# Patient Record
Sex: Male | Born: 1935 | Race: White | Hispanic: No | Marital: Married | Smoking: Never smoker
Health system: Southern US, Community
[De-identification: ages and names within clinical notes are randomized; demographics above are authoritative.]

## PROBLEM LIST (undated history)

## (undated) DIAGNOSIS — E039 Hypothyroidism, unspecified: Secondary | ICD-10-CM

## (undated) DIAGNOSIS — I10 Essential (primary) hypertension: Secondary | ICD-10-CM

## (undated) HISTORY — PX: CERVICAL DISC SURGERY: SHX588

---

## 2013-01-28 ENCOUNTER — Ambulatory Visit: Payer: Self-pay | Admitting: Orthopedic Surgery

## 2013-01-28 ENCOUNTER — Inpatient Hospital Stay: Payer: Self-pay | Admitting: Student

## 2013-01-28 LAB — COMPREHENSIVE METABOLIC PANEL
ALBUMIN: 3.6 g/dL (ref 3.4–5.0)
ALK PHOS: 51 U/L
ALT: 30 U/L (ref 12–78)
AST: 37 U/L (ref 15–37)
Anion Gap: 5 — ABNORMAL LOW (ref 7–16)
BUN: 23 mg/dL — AB (ref 7–18)
Bilirubin,Total: 1.5 mg/dL — ABNORMAL HIGH (ref 0.2–1.0)
CALCIUM: 8.7 mg/dL (ref 8.5–10.1)
CO2: 26 mmol/L (ref 21–32)
Chloride: 104 mmol/L (ref 98–107)
Creatinine: 1.39 mg/dL — ABNORMAL HIGH (ref 0.60–1.30)
EGFR (African American): 54 — ABNORMAL LOW
GFR CALC NON AF AMER: 46 — AB
GLUCOSE: 114 mg/dL — AB (ref 65–99)
Osmolality: 275 (ref 275–301)
POTASSIUM: 4.1 mmol/L (ref 3.5–5.1)
Sodium: 135 mmol/L — ABNORMAL LOW (ref 136–145)
Total Protein: 7.1 g/dL (ref 6.4–8.2)

## 2013-01-28 LAB — CBC WITH DIFFERENTIAL/PLATELET
Basophil #: 0 10*3/uL (ref 0.0–0.1)
Basophil %: 0.3 %
EOS PCT: 0.6 %
Eosinophil #: 0 10*3/uL (ref 0.0–0.7)
HCT: 34 % — AB (ref 40.0–52.0)
HGB: 12.1 g/dL — AB (ref 13.0–18.0)
Lymphocyte #: 0.8 10*3/uL — ABNORMAL LOW (ref 1.0–3.6)
Lymphocyte %: 12.1 %
MCH: 37.5 pg — AB (ref 26.0–34.0)
MCHC: 35.5 g/dL (ref 32.0–36.0)
MCV: 106 fL — ABNORMAL HIGH (ref 80–100)
MONO ABS: 1 x10 3/mm (ref 0.2–1.0)
Monocyte %: 14.7 %
NEUTROS ABS: 4.9 10*3/uL (ref 1.4–6.5)
NEUTROS PCT: 72.3 %
Platelet: 111 10*3/uL — ABNORMAL LOW (ref 150–440)
RBC: 3.21 10*6/uL — AB (ref 4.40–5.90)
RDW: 12.8 % (ref 11.5–14.5)
WBC: 6.8 10*3/uL (ref 3.8–10.6)

## 2013-01-28 LAB — CBC
HCT: 34.7 % — AB (ref 40.0–52.0)
HGB: 12.2 g/dL — ABNORMAL LOW (ref 13.0–18.0)
MCH: 37 pg — ABNORMAL HIGH (ref 26.0–34.0)
MCHC: 35.2 g/dL (ref 32.0–36.0)
MCV: 105 fL — AB (ref 80–100)
PLATELETS: 122 10*3/uL — AB (ref 150–440)
RBC: 3.3 10*6/uL — AB (ref 4.40–5.90)
RDW: 12.8 % (ref 11.5–14.5)
WBC: 7 10*3/uL (ref 3.8–10.6)

## 2013-01-28 LAB — URINALYSIS, COMPLETE
Bacteria: NONE SEEN
Bilirubin,UR: NEGATIVE
Glucose,UR: NEGATIVE mg/dL (ref 0–75)
Leukocyte Esterase: NEGATIVE
Nitrite: NEGATIVE
Ph: 5 (ref 4.5–8.0)
Protein: 25
SPECIFIC GRAVITY: 1.02 (ref 1.003–1.030)
SQUAMOUS EPITHELIAL: NONE SEEN
WBC UR: 2 /HPF (ref 0–5)

## 2013-01-28 LAB — TROPONIN I: Troponin-I: <0.02 ng/mL

## 2013-01-30 LAB — BASIC METABOLIC PANEL
ANION GAP: 5 — AB (ref 7–16)
BUN: 19 mg/dL — ABNORMAL HIGH (ref 7–18)
CHLORIDE: 103 mmol/L (ref 98–107)
CO2: 26 mmol/L (ref 21–32)
Calcium, Total: 8.3 mg/dL — ABNORMAL LOW (ref 8.5–10.1)
Creatinine: 1.22 mg/dL (ref 0.60–1.30)
EGFR (African American): >60
GFR CALC NON AF AMER: 54 — AB
GLUCOSE: 121 mg/dL — AB (ref 65–99)
OSMOLALITY: 272 (ref 275–301)
Potassium: 3.6 mmol/L (ref 3.5–5.1)
Sodium: 134 mmol/L — ABNORMAL LOW (ref 136–145)

## 2013-01-30 LAB — SEDIMENTATION RATE: ERYTHROCYTE SED RATE: 69 mm/h — AB (ref 0–20)

## 2013-02-02 LAB — CULTURE, BLOOD (SINGLE)

## 2013-02-04 LAB — CULTURE, BLOOD (SINGLE)

## 2014-05-18 NOTE — Consult Note (Signed)
PATIENT NAME:  Jeffrey Nielsen, Jeffrey Nielsen MR#:  [id number] DATE OF BIRTH:  [birth year]  DATE OF CONSULTATION:  01/28/2013  CONSULTING PHYSICIAN:  Katha HammingSnehalatha Nakeia Calvi, MD  PRIMARY DOCTOR:  Dr. Leim FabryBarbara Aldridge  REQUESTING PHYSICIAN: Dr. Rodena MedinEckel, Dr. Claris Gladdenamasunder  REASON FOR CONSULT:  Preop clearance for right ankle fracture.   HISTORY OF PRESENT ILLNESS: This is an 81 year old male patient with dementia, hypertension, had a fall on Friday, and Saturday patient suffered a right ankle fracture. The patient's stepson not sure when the fracture happened, but patient was sitting at the edge of the bed and he misjudged where he was sitting, and then he fell on the floor and suffered a right ankle fracture. The patient's stepson said that he lives with a demented wife, and he cannot take care of both of them, and this guy needs lots of help at home with ambulation, so patient's son does not want to take him back. The orthopedic doctor, Dr. Rodena MedinEckel, spoke with Dr. Claris Gladdenamasunder, and patient will probably have surgery tomorrow. We are asked for medical clearance. The patient denies any chest pain or trouble breathing. Did not have any dizziness. According to the son, patient usually walks less than 10 feet overall for a long time, uses a walker. The patient also has a wheelchair, but does not use it. The patient's mobility at home is limited, anyway. The patient refused to come to ER on Saturday, but came today because right ankle was painful.    PAST MEDICAL HISTORY: Significant for hypertension, history of hypothyroidism, and dementia.   MEDICATIONS AT HOME: Aspirin 81 mg daily. Acetaminophen with codeine 300/30 mg 1 tablet p.o. every 6 hours as needed for pain. Cyanocobalamin 1000 mcg p.o. daily. Diazepam 5 mg p.o. daily. Donepezil 10 mg p.o. daily. Ferrous sulfate 325 mg p.o. b.i.d. Levothyroxine 112 mcg p.o. daily. Lisinopril 20 mg p.o. daily but, according to the stepson, he is not taking lisinopril because of his CKD, and he is  not taking lisinopril for about a month.   SOCIAL HISTORY: No smoking. Drinks about 1/2 cup of wine a day. No drugs.   SURGICAL HISTORY: The [facility], [facility].   FAMILY HISTORY: No hypertension or diabetes.   ALLERGIES: No known allergies.   REVIEW OF SYSTEMS:  CONSTITUTIONAL: The patient has no fever. No fatigue.  EYES: No blurred vision and no inflammation.  ENT: No tinnitus. No ear pain. No hearing loss. RESPIRATORY:  No cough. No wheezing. No hemoptysis.  CARDIOVASCULAR: No chest pain, no orthopnea, no PND.  GASTROINTESTINAL: No nausea. No vomiting. No abdominal pain.  GENITOURINARY: No dysuria or hematuria.  ENDOCRINE: No polyuria or nocturia.  INTEGUMENT: No skin rashes.  MUSCULOSKELETAL: Both ankles are swollen, and patient has pain over the ankle on medial and lateral side to palpation.  NEUROLOGICAL: The patient has dementia. At this time, he does not complain of any problems. PSYCHIATRIC:  Has history of dementia.   PHYSICAL EXAMINATION: VITAL SIGNS: Temperature 98.3, heart rate 84, blood pressure 130/66, sats 96% on room air.  GENERAL: The patient is alert, awake, oriented.  HEAD:  Normocephalic, atraumatic.  EYES: Pupils equally reacting to light. No conjunctival pallor. Extraocular movements intact.  NOSE: No nasal lesions.  EARS: No tympanic membrane congestion. No drainage.  MOUTH: No lesions. No exudates.  NECK: Supple. No JVD. No carotid bruit. Thyroid in the midline. RESPIRATORY: Good respiratory effort. Clear to auscultation. No wheeze. No rales.  CARDIOVASCULAR: S1, S2, regular. No murmurs. No gallops.  GASTROINTESTINAL: Abdomen is soft, nontender, nondistended. Bowel sounds present.  MUSCULOSKELETAL: The patient has ankle edema, bilateral pedal edema of the ankles. Right ankle is slightly tender to palpation on the medial side and lateral side.  SKIN:  Warm and dry, well-dehydrated.  NEUROLOGIC:  Cranial nerves II through XII intact. Power 5/5 upper and lower extremities. Sensation intact. DTRs  2+ bilaterally.   LAB DATA: Electrolytes: Sodium 135, potassium 4.1, chloride 104, bicarb 26, BUN 23, creatinine 1.39, glucose 114. WBC 7, hemoglobin 12.2, hematocrit 34.7, platelets 122. Troponin less than 0.02. Ankle x-ray shows right malleolar angle fracture with widening of medial tibial  articular space.The patient also has regional subcutaneous edema. This is right ankle x-ray.   ASSESSMENT AND PLAN: The patient is an 81 year old male with right ankle fracture after   a mechanical fall. The patient has hypertension, dementia, and hypothyroidism. At moderate risk because of advanced age and other medical problems, but surgery can be performed; (Continue IV fluids and use  p.o. beta blockers like metoprolol 2.5 IV q. 4 to 6 hours p.r.n. for blood pressure more than 160/90, and use incentive spirometry. Recommend deep vein thrombosis prophylaxis. Continue other medications for his dementia, hypothyroidism.   Time spent on this consult is about 60 minutes.    ____________________________ Katha Hamming, MD sk:mr D: 01/28/2013 16:53:09 ET T: 01/28/2013 17:45:22 ET JOB#: [id number]  cc: Katha Hamming, MD, <Dictator> Katha Hamming MD ELECTRONICALLY SIGNED 03/05/2013 13:39

## 2014-05-18 NOTE — Discharge Summary (Signed)
PATIENT NAME:  Jeffrey Nielsen, Jeffrey Nielsen MR#:  [id number] DATE OF BIRTH:  January 10, 1927  DATE OF ADMISSION:  01/28/2013 DATE OF DISCHARGE: 02/01/2013   PRIMARY CARE PHYSICIAN: Dr. Leim FabryBarbara Aldridge.   CHIEF COMPLAINT: Status post fall.   DISCHARGE DIAGNOSES:  1.  Right-sided trimalleolar ankle fracture.  2.  Enterococcus bacteremia, unknown source.  3.  Dementia.  4.  Hypertension.  5.  Hypothyroidism.   DISCHARGE MEDICATIONS: Aspirin 81 mg daily, cyanocobalamin 1000 mcg daily, diazepam 5 mg daily, donepezil 10 mg daily, ferrous sulfate 325 mg 2 times a day, levothyroxine 112 mcg daily, lisinopril 20 mg daily, diclofenac topical 1% gel 2 grams topically to affected area 2 times a day, amoxicillin 500 mg 2 caps every 8t hours until the evening of the 18th of January.   DIET: Low sodium.   ACTIVITY: As tolerated without any bearing on the right lower extremity.   FOLLOWUP: Please follow with PCP within 1 to 2 weeks. Please follow with orthopedics within 1 week.   DISPOSITION: Home.  For full and further limitations, please refer to orthopedic discharge instructions.  SIGNIFICANT LABS AND IMAGING: Initial BUN 23, creatinine 1.39, sodium 135. Initial blood cultures with 1 out of 2 bottles for Enterococcus faecalis. Repeat blood cultures on the 6th, no growth to date x 2. Urinalysis does not suggest an infection. Echocardiogram done on January 06 shows ejection fraction of 60% to 65%, impaired relaxation pattern and no mention of possible vegetation. An x-ray of the chest, one view, on the 4th showing no active disease. Right ankle complete x-ray showing trimalleolar fracture with widening of the medial tibiotalar articular space. Right ankle AP and lateral, showing no significant change of the trimalleolar fracture with a cast on January 04.   HISTORY OF PRESENT ILLNESS AND HOSPITAL COURSE: For full details of H and P, please see the dictation on day of admission by the admitting physician Dr. Rodena MedinEckel from  orthopedics. The patient is an 81 year old male with dementia, who had a few falls. He came in with a trimalleolar fracture and admitted to the orthopedic service and medicine was initially consulted for preop evaluation. Per the patient's step-son, he is not sure when the fracture happened as patient had a couple of falls on 2 different days. The bed is kind of high for the patient and he misjudged where he was sitting, fell down and suffered a right ankle fracture. Of note, he did not have any fevers, chills, or significant leukocytosis; however, some blood cultures were sent on the day of admission and 1 out of 2 bottles came back for gram-positive cocci. Surgery was therefore held and the patient was started on vancomycin. Ultimately, the blood cultures grew enterococcus. Repeat blood cultures have been sent on the 6th and there has been no growth to date. However, given the bacteremia, the surgery will be rescheduled at a later date. I ran the case with infectious disease doctor, who feels comfortable with about 10 to 14 days of total treatment for this enterococcus with negative followup blood cultures. We would go ahead and discharge him on a total of 14 days of treatment. Would discharge him on amoxicillin p.o. q.8 hours. In regards to his hypertension, it has remained stable. He will follow with orthopedics as an outpatient for the ankle fracture. At this point, he denies any pain.   TOTAL TIME SPENT: 35 minutes.   ____________________________ Krystal EatonShayiq Karess Harner, MD sa:aw D: 02/01/2013 13:30:53 ET T: 02/01/2013 13:43:28 ET JOB#: [id number]  cc: HYQMVHShayiq  Jacques Navy, MD, <Dictator> Katina Dung. Dayna Barker, MD Krystal Eaton MD ELECTRONICALLY SIGNED 02/06/2013 11:35

## 2014-05-18 NOTE — H&P (Signed)
   Subjective/Chief Complaint right ankle pain   History of Present Illness 81 y/o male who sustained a fall with immediate ankle pain and inability to bear weight   Past History See below   Past Med/Surgical Hx:  Anemia:   chronic kidney disease:   fecal incontinence:   Incontinence, Urinary:   GERD - Esophageal Reflux:   Anxiety:   Arthritis:   Hypothyroidism:   HTN:   Dementia:   ALLERGIES:  No Known Allergies:   Family and Social History:  Family History Non-Contributory   Social History negative tobacco, negative ETOH   Place of Living Home   Review of Systems:  Fever/Chills No   Cough No   SOB/DOE No   Chest Pain No   Physical Exam:  GEN thin   NECK supple   RESP normal resp effort   CARD regular rate   ABD denies tenderness   EXTR positive edema, RLE   NEURO motor/sensory function intact   PSYCH poor insight   Additional Comments Mildly demented    Assessment/Admission Diagnosis Right closed bimalleolar ankle fracture   Plan Patient will be admitted and elevated overnight to control swelling. Depending on his soft tissue tomorrow, we may proceed with ORIF or possibly nonoperative management of his fracture   Electronic Signatures: Danelle EarthlyEckel, Tobin T (MD)  (Signed 04-Jan-15 19:24)  Authored: CHIEF COMPLAINT and HISTORY, PAST MEDICAL/SURGIAL HISTORY, ALLERGIES, FAMILY AND SOCIAL HISTORY, REVIEW OF SYSTEMS, PHYSICAL EXAM, ASSESSMENT AND PLAN   Last Updated: 04-Jan-15 19:24 by Danelle EarthlyEckel, Tobin T (MD)

## 2014-05-18 NOTE — Consult Note (Signed)
PATIENT NAME:  Jeffrey Nielsen, Jeffrey Nielsen MR#:  [id number] DATE OF BIRTH:  [birth year]  DATE OF CONSULTATION:  01/28/2013  CONSULTING PHYSICIAN:  Katha HammingSnehalatha Delaynie Stetzer, MD  ADDENDUM FOR CONSULTATION:     Chest x-ray is clear and EKG looks normal sinus rhythm with no ST-T changes. So, the  patient is at moderate risk because of his advanced age.    ____________________________ Katha HammingSnehalatha Floride Hutmacher, MD sk:cc D: 01/28/2013 18:51:43 ET T: 01/28/2013 19:29:21 ET JOB#: [id number]  cc: Katha HammingSnehalatha Arissa Fagin, MD, <Dictator> Katha HammingSNEHALATHA Crucita Lacorte MD ELECTRONICALLY SIGNED 03/05/2013 13:37

## 2020-03-29 ENCOUNTER — Encounter: Payer: Self-pay | Admitting: Radiology

## 2020-03-29 ENCOUNTER — Inpatient Hospital Stay
Admission: EM | Admit: 2020-03-29 | Discharge: 2020-04-01 | DRG: 315 | Disposition: A | Discharge status: Skilled Nursing Facility | Payer: Medicare Other | Attending: Internal Medicine | Admitting: Internal Medicine

## 2020-03-29 ENCOUNTER — Other Ambulatory Visit: Payer: Self-pay

## 2020-03-29 ENCOUNTER — Emergency Department: Payer: Medicare Other

## 2020-03-29 DIAGNOSIS — N179 Acute kidney failure, unspecified: Secondary | ICD-10-CM | POA: Diagnosis present

## 2020-03-29 DIAGNOSIS — G301 Alzheimer's disease with late onset: Secondary | ICD-10-CM

## 2020-03-29 DIAGNOSIS — R55 Syncope and collapse: Secondary | ICD-10-CM

## 2020-03-29 DIAGNOSIS — I959 Hypotension, unspecified: Principal | ICD-10-CM | POA: Diagnosis present

## 2020-03-29 DIAGNOSIS — I248 Other forms of acute ischemic heart disease: Secondary | ICD-10-CM | POA: Diagnosis present

## 2020-03-29 DIAGNOSIS — I10 Essential (primary) hypertension: Secondary | ICD-10-CM | POA: Diagnosis present

## 2020-03-29 DIAGNOSIS — G309 Alzheimer's disease, unspecified: Secondary | ICD-10-CM | POA: Diagnosis present

## 2020-03-29 DIAGNOSIS — F0390 Unspecified dementia without behavioral disturbance: Secondary | ICD-10-CM

## 2020-03-29 DIAGNOSIS — E860 Dehydration: Secondary | ICD-10-CM | POA: Diagnosis present

## 2020-03-29 DIAGNOSIS — Z79899 Other long term (current) drug therapy: Secondary | ICD-10-CM

## 2020-03-29 DIAGNOSIS — E872 Acidosis: Secondary | ICD-10-CM | POA: Diagnosis present

## 2020-03-29 DIAGNOSIS — E039 Hypothyroidism, unspecified: Secondary | ICD-10-CM | POA: Diagnosis present

## 2020-03-29 DIAGNOSIS — Z66 Do not resuscitate: Secondary | ICD-10-CM | POA: Diagnosis present

## 2020-03-29 DIAGNOSIS — Z20822 Contact with and (suspected) exposure to covid-19: Secondary | ICD-10-CM | POA: Diagnosis present

## 2020-03-29 DIAGNOSIS — F0280 Dementia in other diseases classified elsewhere without behavioral disturbance: Secondary | ICD-10-CM | POA: Diagnosis present

## 2020-03-29 DIAGNOSIS — D649 Anemia, unspecified: Secondary | ICD-10-CM | POA: Diagnosis present

## 2020-03-29 DIAGNOSIS — R1310 Dysphagia, unspecified: Secondary | ICD-10-CM | POA: Diagnosis present

## 2020-03-29 DIAGNOSIS — D61818 Other pancytopenia: Secondary | ICD-10-CM

## 2020-03-29 DIAGNOSIS — Z7952 Long term (current) use of systemic steroids: Secondary | ICD-10-CM

## 2020-03-29 DIAGNOSIS — E875 Hyperkalemia: Secondary | ICD-10-CM | POA: Diagnosis present

## 2020-03-29 DIAGNOSIS — R778 Other specified abnormalities of plasma proteins: Secondary | ICD-10-CM

## 2020-03-29 DIAGNOSIS — Z79890 Hormone replacement therapy (postmenopausal): Secondary | ICD-10-CM

## 2020-03-29 DIAGNOSIS — I493 Ventricular premature depolarization: Secondary | ICD-10-CM | POA: Diagnosis present

## 2020-03-29 HISTORY — DX: Hypothyroidism, unspecified: E03.9

## 2020-03-29 HISTORY — DX: Essential (primary) hypertension: I10

## 2020-03-29 LAB — CBC WITH DIFFERENTIAL/PLATELET
Abs Immature Granulocytes: 0.01 10*3/uL (ref 0.00–0.07)
Basophils Absolute: 0 10*3/uL (ref 0.0–0.1)
Basophils Relative: 0 %
Eosinophils Absolute: 0.1 10*3/uL (ref 0.0–0.5)
Eosinophils Relative: 2 %
HCT: 29.8 % — ABNORMAL LOW (ref 39.0–52.0)
Hemoglobin: 10.3 g/dL — ABNORMAL LOW (ref 13.0–17.0)
Immature Granulocytes: 0 %
Lymphocytes Relative: 23 %
Lymphs Abs: 0.7 10*3/uL (ref 0.7–4.0)
MCH: 37.3 pg — ABNORMAL HIGH (ref 26.0–34.0)
MCHC: 34.6 g/dL (ref 30.0–36.0)
MCV: 108 fL — ABNORMAL HIGH (ref 80.0–100.0)
Monocytes Absolute: 0.3 10*3/uL (ref 0.1–1.0)
Monocytes Relative: 11 %
Neutro Abs: 1.9 10*3/uL (ref 1.7–7.7)
Neutrophils Relative %: 64 %
Platelets: 70 10*3/uL — ABNORMAL LOW (ref 150–400)
RBC: 2.76 MIL/uL — ABNORMAL LOW (ref 4.22–5.81)
RDW: 13.1 % (ref 11.5–15.5)
WBC: 3 10*3/uL — ABNORMAL LOW (ref 4.0–10.5)
nRBC: 0 % (ref 0.0–0.2)

## 2020-03-29 LAB — BASIC METABOLIC PANEL
Anion gap: 9 (ref 5–15)
BUN: 45 mg/dL — ABNORMAL HIGH (ref 8–23)
CO2: 18 mmol/L — ABNORMAL LOW (ref 22–32)
Calcium: 7.7 mg/dL — ABNORMAL LOW (ref 8.9–10.3)
Chloride: 112 mmol/L — ABNORMAL HIGH (ref 98–111)
Creatinine, Ser: 1.88 mg/dL — ABNORMAL HIGH (ref 0.61–1.24)
GFR, Estimated: 33 mL/min — ABNORMAL LOW (ref >60)
Glucose, Bld: 132 mg/dL — ABNORMAL HIGH (ref 70–99)
Potassium: 4.7 mmol/L (ref 3.5–5.1)
Sodium: 139 mmol/L (ref 135–145)

## 2020-03-29 LAB — RESP PANEL BY RT-PCR (FLU A&B, COVID) ARPGX2
Influenza A by PCR: NEGATIVE
Influenza B by PCR: NEGATIVE
SARS Coronavirus 2 by RT PCR: NEGATIVE

## 2020-03-29 LAB — TROPONIN I (HIGH SENSITIVITY): Troponin I (High Sensitivity): 38 ng/L — ABNORMAL HIGH (ref <18)

## 2020-03-29 MED ORDER — SODIUM CHLORIDE 0.9 % IV BOLUS
500.0000 mL | Freq: Once | INTRAVENOUS | Status: AC
  Administered 2020-03-29: 500 mL via INTRAVENOUS

## 2020-03-29 NOTE — ED Triage Notes (Signed)
BIB EMS from [facility]. Per facility pt had multiple syncopal episodes today.

## 2020-03-29 NOTE — ED Provider Notes (Signed)
[facility] Emergency Department Provider Note  ____________________________________________  Time seen: Approximately 11:44 PM  I have reviewed the triage vital signs and the nursing notes.   HISTORY  Chief Complaint Loss of Consciousness    Level 5 Caveat: Portions of the History and Physical including HPI and review of systems are unable to be completely obtained due to patient being a poor historian   HPI Jeffrey Nielsen is a 86 y.o. male with a past history of hypertension, dementia who was sent to the ED due to recurrent syncope today.  Patient denies any complaints, states that he feels fine.  EMS report that blood pressure was about 80/40 supine, dropped to 60/40 when he was sitting upright.      History reviewed. No pertinent past medical history.   There are no problems to display for this patient.       Prior to Admission medications   Medication Sig Start Date End Date Taking? Authorizing Provider  ALOE VESTA CLEAR ANTIFUNGAL 2 % OINT Apply topically. 02/08/20  Yes [provider]  divalproex (DEPAKOTE) 125 MG DR tablet Take 125 mg by mouth at bedtime. 03/18/20  Yes [provider]  donepezil (ARICEPT) 10 MG tablet Take 10 mg by mouth daily. 03/18/20  Yes [provider]  levothyroxine (SYNTHROID) 112 MCG tablet Take 112 mcg by mouth daily. 03/10/20  Yes [provider]  lisinopril (ZESTRIL) 20 MG tablet Take 20 mg by mouth daily. 02/29/20  Yes [provider]  loperamide (IMODIUM A-D) 2 MG tablet Take 2 mg by mouth 4 (four) times daily as needed for diarrhea or loose stools. Take after each loose stool, not to exceed 8 mg in 24 hours.   Yes [provider]     Allergies Patient has no known allergies.   No family history on file.  Social History    Review of Systems Level 5 Caveat: Portions of the History and Physical including HPI and review of systems are unable to be completely  obtained due to patient being a poor historian   Constitutional:   No known fever.  ENT:   No rhinorrhea. Cardiovascular:   No chest pain or syncope. Respiratory:   No dyspnea or cough. Gastrointestinal:   Negative for abdominal pain, vomiting and diarrhea.  Musculoskeletal:   Negative for focal pain or swelling ____________________________________________   PHYSICAL EXAM:  VITAL SIGNS: ED Triage Vitals  Enc Vitals Group     BP 03/29/20 2220 (!) 104/45     Pulse Rate 03/29/20 2220 78     Resp 03/29/20 2220 13     Temp 03/29/20 2220 98 F (36.7 C)     Temp Source 03/29/20 2220 Oral     SpO2 03/29/20 2220 95 %     Weight 03/29/20 2219 216 lb (98 kg)     Height 03/29/20 2219 6\' 4"  (1.93 m)     Head Circumference --      Peak Flow --      Pain Score 03/29/20 2218 0     Pain Loc --      Pain Edu? --      Excl. in GC? --     Vital signs reviewed, nursing assessments reviewed.   Constitutional: Awake and alert, oriented to self. Non-toxic appearance. Eyes:   Conjunctivae are normal. EOMI. PERRL. ENT      Head:   Normocephalic and atraumatic.      Nose:   No congestion/rhinnorhea.  Mouth/Throat:   Dry mucous membranes, no pharyngeal erythema. No peritonsillar mass.       Neck:   No meningismus. Full ROM. Hematological/Lymphatic/Immunilogical:   No cervical lymphadenopathy. Cardiovascular:   RRR. Symmetric bilateral radial and DP pulses.  No murmurs. Cap refill less than 2 seconds. Respiratory:   Normal respiratory effort without tachypnea/retractions. Breath sounds are clear and equal bilaterally. No wheezes/rales/rhonchi. Gastrointestinal:   Soft and nontender. Non distended. There is no CVA tenderness.  No rebound, rigidity, or guarding.  Musculoskeletal:   Normal range of motion in all extremities. No joint effusions.  No lower extremity tenderness.  No edema. Neurologic:   Normal speech and language.  Motor grossly intact. No acute focal neurologic deficits are  appreciated.  Skin:    Skin is warm, dry and intact. No rash noted.  No petechiae, purpura, or bullae.  ____________________________________________    LABS (pertinent positives/negatives) (all labs ordered are listed, but only abnormal results are displayed) Labs Reviewed  BASIC METABOLIC PANEL - Abnormal; Notable for the following components:      Result Value   Chloride 112 (*)    CO2 18 (*)    Glucose, Bld 132 (*)    BUN 45 (*)    Creatinine, Ser 1.88 (*)    Calcium 7.7 (*)    GFR, Estimated 33 (*)    All other components within normal limits  CBC WITH DIFFERENTIAL/PLATELET - Abnormal; Notable for the following components:   WBC 3.0 (*)    RBC 2.76 (*)    Hemoglobin 10.3 (*)    HCT 29.8 (*)    MCV 108.0 (*)    MCH 37.3 (*)    Platelets 70 (*)    All other components within normal limits  TROPONIN I (HIGH SENSITIVITY) - Abnormal; Notable for the following components:   Troponin I (High Sensitivity) 38 (*)    All other components within normal limits  RESP PANEL BY RT-PCR (FLU A&B, COVID) ARPGX2  URINE CULTURE  URINALYSIS, COMPLETE (UACMP) WITH MICROSCOPIC   ____________________________________________   EKG  Interpreted by me Normal sinus rhythm rate of 78, normal axis and intervals.  Normal QRS ST segments and T waves.  1 pair of PVCs on the strip.  ____________________________________________    RADIOLOGY  DG Chest Portable 1 View  Result Date: 03/29/2020 CLINICAL DATA:  Weakness. EXAM: PORTABLE CHEST 1 VIEW COMPARISON:  Chest x-ray 01/28/2013 FINDINGS: The heart size and mediastinal contours are unchanged. Aortic arch calcifications. No focal consolidation. No pulmonary edema. No pleural effusion. No pneumothorax. No acute osseous abnormality. IMPRESSION: No active disease. Electronically Signed   By: Tish Frederickson M.D.   On: 03/29/2020 22:58    ____________________________________________   PROCEDURES .Critical Care Performed by: Sharman Cheek,  MD Authorized by: Sharman Cheek, MD   Critical care provider statement:    Critical care time (minutes):  35   Critical care time was exclusive of:  Separately billable procedures and treating other patients   Critical care was necessary to treat or prevent imminent or life-threatening deterioration of the following conditions:  Dehydration and circulatory failure   Critical care was time spent personally by me on the following activities:  Development of treatment plan with patient or surrogate, discussions with consultants, evaluation of patient's response to treatment, examination of patient, obtaining history from patient or surrogate, ordering and performing treatments and interventions, ordering and review of laboratory studies, ordering and review of radiographic studies, pulse oximetry, re-evaluation of patient's condition and review of  old charts    ____________________________________________  DIFFERENTIAL DIAGNOSIS   Dehydration, electrolyte abnormality, pneumonia, UTI, viral illness, non-STEMI  CLINICAL IMPRESSION / ASSESSMENT AND PLAN / ED COURSE  Medications ordered in the ED: Medications  sodium chloride 0.9 % bolus 500 mL (0 mLs Intravenous Stopped 03/29/20 2300)    Pertinent labs & imaging results that were available during my care of the patient were reviewed by me and considered in my medical decision making (see chart for details).   Jeffrey Nielsen was evaluated in Emergency Department on 03/29/2020 for the symptoms described in the history of present illness. He was evaluated in the context of the global COVID-19 pandemic, which necessitated consideration that the patient might be at risk for infection with the SARS-CoV-2 virus that causes COVID-19. Institutional protocols and algorithms that pertain to the evaluation of patients at risk for COVID-19 are in a state of rapid change based on information released by regulatory bodies including the CDC and federal and state  organizations. These policies and algorithms were followed during the patient's care in the ED.   Patient sent to the ED due to syncope at his nursing facility.  Found to be hypotensive, worsened with orthostatic changes.  Patient denies symptoms but is unreliable due to his dementia.  Exam is benign.  Chest x-ray viewed and interpreted by me, no pneumonia.  Radiology report also agrees is unremarkable.  Initial serum labs show AKI.  Awaiting Covid and urinalysis results.  With syncope, hypotension, AKI and patient's age, will plan to admit for hydration and monitoring.  Patient not septic with otherwise normal vital signs      ____________________________________________   FINAL CLINICAL IMPRESSION(S) / ED DIAGNOSES    Final diagnoses:  Hypotension, unspecified hypotension type  Dementia without behavioral disturbance, unspecified dementia type [facility])     ED Discharge Orders    None      Portions of this note were generated with dragon dictation software. Dictation errors may occur despite best attempts at proofreading.   Sharman Cheek, MD 03/29/20 2348

## 2020-03-30 ENCOUNTER — Encounter: Payer: Self-pay | Admitting: Internal Medicine

## 2020-03-30 DIAGNOSIS — Z20822 Contact with and (suspected) exposure to covid-19: Secondary | ICD-10-CM | POA: Diagnosis present

## 2020-03-30 DIAGNOSIS — R55 Syncope and collapse: Secondary | ICD-10-CM | POA: Diagnosis present

## 2020-03-30 DIAGNOSIS — I248 Other forms of acute ischemic heart disease: Secondary | ICD-10-CM | POA: Diagnosis present

## 2020-03-30 DIAGNOSIS — R1310 Dysphagia, unspecified: Secondary | ICD-10-CM | POA: Diagnosis present

## 2020-03-30 DIAGNOSIS — E039 Hypothyroidism, unspecified: Secondary | ICD-10-CM | POA: Diagnosis present

## 2020-03-30 DIAGNOSIS — E875 Hyperkalemia: Secondary | ICD-10-CM | POA: Diagnosis present

## 2020-03-30 DIAGNOSIS — E860 Dehydration: Secondary | ICD-10-CM | POA: Diagnosis present

## 2020-03-30 DIAGNOSIS — D649 Anemia, unspecified: Secondary | ICD-10-CM | POA: Diagnosis present

## 2020-03-30 DIAGNOSIS — E872 Acidosis: Secondary | ICD-10-CM | POA: Diagnosis present

## 2020-03-30 DIAGNOSIS — G301 Alzheimer's disease with late onset: Secondary | ICD-10-CM | POA: Diagnosis not present

## 2020-03-30 DIAGNOSIS — I959 Hypotension, unspecified: Secondary | ICD-10-CM | POA: Diagnosis present

## 2020-03-30 DIAGNOSIS — I10 Essential (primary) hypertension: Secondary | ICD-10-CM | POA: Diagnosis present

## 2020-03-30 DIAGNOSIS — D61818 Other pancytopenia: Secondary | ICD-10-CM | POA: Diagnosis present

## 2020-03-30 DIAGNOSIS — G309 Alzheimer's disease, unspecified: Secondary | ICD-10-CM | POA: Diagnosis present

## 2020-03-30 DIAGNOSIS — Z79890 Hormone replacement therapy (postmenopausal): Secondary | ICD-10-CM | POA: Diagnosis not present

## 2020-03-30 DIAGNOSIS — F0280 Dementia in other diseases classified elsewhere without behavioral disturbance: Secondary | ICD-10-CM | POA: Diagnosis present

## 2020-03-30 DIAGNOSIS — Z66 Do not resuscitate: Secondary | ICD-10-CM | POA: Diagnosis present

## 2020-03-30 DIAGNOSIS — I493 Ventricular premature depolarization: Secondary | ICD-10-CM | POA: Diagnosis present

## 2020-03-30 DIAGNOSIS — N179 Acute kidney failure, unspecified: Secondary | ICD-10-CM | POA: Diagnosis present

## 2020-03-30 DIAGNOSIS — Z79899 Other long term (current) drug therapy: Secondary | ICD-10-CM | POA: Diagnosis not present

## 2020-03-30 DIAGNOSIS — R778 Other specified abnormalities of plasma proteins: Secondary | ICD-10-CM

## 2020-03-30 DIAGNOSIS — Z7952 Long term (current) use of systemic steroids: Secondary | ICD-10-CM | POA: Diagnosis not present

## 2020-03-30 LAB — IRON AND TIBC
Iron: 58 ug/dL (ref 45–182)
Saturation Ratios: 28 % (ref 17.9–39.5)
TIBC: 210 ug/dL — ABNORMAL LOW (ref 250–450)
UIBC: 152 ug/dL

## 2020-03-30 LAB — RETICULOCYTES
Immature Retic Fract: 12.3 % (ref 2.3–15.9)
RBC.: 2.86 MIL/uL — ABNORMAL LOW (ref 4.22–5.81)
Retic Count, Absolute: 38.9 10*3/uL (ref 19.0–186.0)
Retic Ct Pct: 1.4 % (ref 0.4–3.1)

## 2020-03-30 LAB — CBC
HCT: 28.5 % — ABNORMAL LOW (ref 39.0–52.0)
Hemoglobin: 10.1 g/dL — ABNORMAL LOW (ref 13.0–17.0)
MCH: 37.8 pg — ABNORMAL HIGH (ref 26.0–34.0)
MCHC: 35.4 g/dL (ref 30.0–36.0)
MCV: 106.7 fL — ABNORMAL HIGH (ref 80.0–100.0)
Platelets: 68 10*3/uL — ABNORMAL LOW (ref 150–400)
RBC: 2.67 MIL/uL — ABNORMAL LOW (ref 4.22–5.81)
RDW: 13 % (ref 11.5–15.5)
WBC: 2.6 10*3/uL — ABNORMAL LOW (ref 4.0–10.5)
nRBC: 0 % (ref 0.0–0.2)

## 2020-03-30 LAB — URINALYSIS, COMPLETE (UACMP) WITH MICROSCOPIC
Bilirubin Urine: NEGATIVE
Glucose, UA: NEGATIVE mg/dL
Hgb urine dipstick: NEGATIVE
Ketones, ur: 5 mg/dL — AB
Leukocytes,Ua: NEGATIVE
Nitrite: NEGATIVE
Protein, ur: NEGATIVE mg/dL
Specific Gravity, Urine: 1.018 (ref 1.005–1.030)
Squamous Epithelial / HPF: NONE SEEN (ref 0–5)
pH: 5 (ref 5.0–8.0)

## 2020-03-30 LAB — BASIC METABOLIC PANEL
Anion gap: 4 — ABNORMAL LOW (ref 5–15)
BUN: 44 mg/dL — ABNORMAL HIGH (ref 8–23)
CO2: 20 mmol/L — ABNORMAL LOW (ref 22–32)
Calcium: 8 mg/dL — ABNORMAL LOW (ref 8.9–10.3)
Chloride: 113 mmol/L — ABNORMAL HIGH (ref 98–111)
Creatinine, Ser: 1.76 mg/dL — ABNORMAL HIGH (ref 0.61–1.24)
GFR, Estimated: 36 mL/min — ABNORMAL LOW (ref >60)
Glucose, Bld: 135 mg/dL — ABNORMAL HIGH (ref 70–99)
Potassium: 5.3 mmol/L — ABNORMAL HIGH (ref 3.5–5.1)
Sodium: 137 mmol/L (ref 135–145)

## 2020-03-30 LAB — FERRITIN: Ferritin: 436 ng/mL — ABNORMAL HIGH (ref 24–336)

## 2020-03-30 LAB — TROPONIN I (HIGH SENSITIVITY): Troponin I (High Sensitivity): 42 ng/L — ABNORMAL HIGH (ref <18)

## 2020-03-30 LAB — TSH: TSH: 0.262 u[IU]/mL — ABNORMAL LOW (ref 0.350–4.500)

## 2020-03-30 LAB — VITAMIN B12: Vitamin B-12: 280 pg/mL (ref 180–914)

## 2020-03-30 LAB — FOLATE: Folate: 10.2 ng/mL (ref >5.9)

## 2020-03-30 MED ORDER — ONDANSETRON HCL 4 MG/2ML IJ SOLN: 4.0000 mg | Freq: Four times a day (QID) | INTRAMUSCULAR | Status: DC | PRN

## 2020-03-30 MED ORDER — SODIUM CHLORIDE 0.9 % IV BOLUS
500.0000 mL | Freq: Once | INTRAVENOUS | Status: AC
  Administered 2020-03-30: 500 mL via INTRAVENOUS

## 2020-03-30 MED ORDER — DONEPEZIL HCL 5 MG PO TABS
10.0000 mg | ORAL_TABLET | Freq: Every day | ORAL | Status: DC
  Administered 2020-03-30 – 2020-04-01 (×3): 10 mg via ORAL
  Filled 2020-03-30 (×3): qty 2

## 2020-03-30 MED ORDER — DIVALPROEX SODIUM 125 MG PO DR TAB
125.0000 mg | DELAYED_RELEASE_TABLET | Freq: Every day | ORAL | Status: DC
  Administered 2020-03-30 – 2020-03-31 (×2): 125 mg via ORAL
  Filled 2020-03-30 (×3): qty 1

## 2020-03-30 MED ORDER — SODIUM CHLORIDE 0.9 % IV SOLN: INTRAVENOUS | Status: DC

## 2020-03-30 MED ORDER — ACETAMINOPHEN 325 MG PO TABS: 650.0000 mg | ORAL_TABLET | Freq: Four times a day (QID) | ORAL | Status: DC | PRN

## 2020-03-30 MED ORDER — ONDANSETRON HCL 4 MG PO TABS: 4.0000 mg | ORAL_TABLET | Freq: Four times a day (QID) | ORAL | Status: DC | PRN

## 2020-03-30 MED ORDER — LEVOTHYROXINE SODIUM 112 MCG PO TABS
112.0000 ug | ORAL_TABLET | Freq: Every day | ORAL | Status: DC
  Administered 2020-03-31 – 2020-04-01 (×2): 112 ug via ORAL
  Filled 2020-03-30 (×2): qty 1

## 2020-03-30 MED ORDER — ACETAMINOPHEN 650 MG RE SUPP: 650.0000 mg | Freq: Four times a day (QID) | RECTAL | Status: DC | PRN

## 2020-03-30 NOTE — ED Notes (Signed)
Pt incontinent of urine. Brief and bed linen saturated. 2 RN's cleaned pt and changed bed linen. New brief placed. primofit placed on pt.

## 2020-03-30 NOTE — H&P (Signed)
History and Physical    Jeffrey Nielsen [id number]:[id number] DOB: Jun 08, 1926 DOA: 03/29/2020  PCP: System, Provider Not In   Patient coming from: Nursing home  I have personally briefly reviewed patient's old medical records in [facility]  Chief Complaint: syncopal episodes x 1 day  HPI: Jeffrey Nielsen is a 87 y.o. male with medical history significant for hypertension and hypothyroidism and occasional falls who was sent to the emergency room because of recurrent syncopal episodes x 1 day.  Patient was previously in his usual state of health.  He has had no recent illness, no cough, shortness of breath, fever or chills.  He has had no vomiting or diarrhea, abdominal pain and no dysuria.  History is limited due to dementia and is compiled by ER notes and by speaking with his stepson and POA, [employer]..  On arrival of EMS, BP was reportedly 60/40. ED Course: Upon arrival, he was afebrile, BP 104/45, pulse 78, respirations 18 with O2 sat 95% on room air.  Blood work significant for WBC of 3000, hemoglobin 10.3, platelets 70,000 with no recent on file for comparison.  BMP significant for creatinine of 1.88 with bicarb of 18 and no recent on file.  Troponin 38.  Covid and flu negative EKG as interpreted by me: NSR at 78 with a few PVCs Imaging: Chest x-ray with no active disease  Patient was hydrated with EMS and in the emergency room with blood pressure of 110/55 at the time of request for admission.   Review of Systems: Unreliable due to history of dementia   Past Medical History:  Diagnosis Date  . HTN (hypertension)   . Hypothyroidism     Past Surgical History:  Procedure Laterality Date  . CERVICAL DISC SURGERY       reports that he has never smoked. He has never used smokeless tobacco. He reports previous alcohol use. No history on file for drug use.  No Known Allergies  History reviewed. No pertinent family history.    Prior to Admission medications   Medication Sig Start  Date End Date Taking? Authorizing Provider  ALOE VESTA CLEAR ANTIFUNGAL 2 % OINT Apply topically. 02/08/20  Yes [provider]  divalproex (DEPAKOTE) 125 MG DR tablet Take 125 mg by mouth at bedtime. 03/18/20  Yes [provider]  donepezil (ARICEPT) 10 MG tablet Take 10 mg by mouth daily. 03/18/20  Yes [provider]  levothyroxine (SYNTHROID) 112 MCG tablet Take 112 mcg by mouth daily. 03/10/20  Yes [provider]  lisinopril (ZESTRIL) 20 MG tablet Take 20 mg by mouth daily. 02/29/20  Yes [provider]  loperamide (IMODIUM A-D) 2 MG tablet Take 2 mg by mouth 4 (four) times daily as needed for diarrhea or loose stools. Take after each loose stool, not to exceed 8 mg in 24 hours.   Yes [provider]    Physical Exam: Vitals:   03/29/20 2219 03/29/20 2220 03/29/20 2300  BP:  (!) 104/45 (!) 95/44  Pulse:  78 74  Resp:  13 14  Temp:  98 F (36.7 C)   TempSrc:  Oral   SpO2:  95% 94%  Weight: 98 kg    Height: 6\' 4"  (1.93 m)       Vitals:   03/29/20 2219 03/29/20 2220 03/29/20 2300  BP:  (!) 104/45 (!) 95/44  Pulse:  78 74  Resp:  13 14  Temp:  98 F (36.7 C)   TempSrc:  Oral  SpO2:  95% 94%  Weight: 98 kg    Height: 6\' 4"  (1.93 m)        Constitutional: Alert and oriented x 2 . Not in any apparent distress HEENT:      Head: Normocephalic and atraumatic.         Eyes: PERLA, EOMI, Conjunctivae are normal. Sclera is non-icteric.       Mouth/Throat: Mucous membranes are moist.       Neck: Supple with no signs of meningismus. Cardiovascular: Regular rate and rhythm. No murmurs, gallops, or rubs. 2+ symmetrical distal pulses are present . No JVD. 2+LE edema Respiratory: Respiratory effort normal .Lungs sounds clear bilaterally. No wheezes, crackles, or rhonchi.  Gastrointestinal: Soft, non tender, and non distended with positive bowel sounds.  Genitourinary: No CVA tenderness. Musculoskeletal: Nontender with normal range  of motion in all extremities. No cyanosis, or erythema of extremities. Neurologic:  Face is symmetric. Moving all extremities. No gross focal neurologic deficits . Skin: Skin is warm, dry.  No rash or ulcers Psychiatric: Mood and affect are normal    Labs on Admission: I have personally reviewed following labs and imaging studies  CBC: Recent Labs  Lab 03/29/20 2225  WBC 3.0*  NEUTROABS 1.9  HGB 10.3*  HCT 29.8*  MCV 108.0*  PLT 70*   Basic Metabolic Panel: Recent Labs  Lab 03/29/20 2225  NA 139  K 4.7  CL 112*  CO2 18*  GLUCOSE 132*  BUN 45*  CREATININE 1.88*  CALCIUM 7.7*   GFR: Estimated Creatinine Clearance: 31.4 mL/min (A) (by C-G formula based on SCr of 1.88 mg/dL (H)). Liver Function Tests: No results for input(s): AST, ALT, ALKPHOS, BILITOT, PROT, ALBUMIN in the last 168 hours. No results for input(s): LIPASE, AMYLASE in the last 168 hours. No results for input(s): AMMONIA in the last 168 hours. Coagulation Profile: No results for input(s): INR, PROTIME in the last 168 hours. Cardiac Enzymes: No results for input(s): CKTOTAL, CKMB, CKMBINDEX, TROPONINI in the last 168 hours. BNP (last 3 results) No results for input(s): PROBNP in the last 8760 hours. HbA1C: No results for input(s): HGBA1C in the last 72 hours. CBG: No results for input(s): GLUCAP in the last 168 hours. Lipid Profile: No results for input(s): CHOL, HDL, LDLCALC, TRIG, CHOLHDL, LDLDIRECT in the last 72 hours. Thyroid Function Tests: No results for input(s): TSH, T4TOTAL, FREET4, T3FREE, THYROIDAB in the last 72 hours. Anemia Panel: No results for input(s): VITAMINB12, FOLATE, FERRITIN, TIBC, IRON, RETICCTPCT in the last 72 hours. Urine analysis:    Component Value Date/Time   COLORURINE Amber 01/28/2013 1922   APPEARANCEUR Clear 01/28/2013 1922   LABSPEC 1.020 01/28/2013 1922   PHURINE 5.0 01/28/2013 1922   GLUCOSEU Negative 01/28/2013 1922   HGBUR 2+ 01/28/2013 1922   BILIRUBINUR  Negative 01/28/2013 1922   KETONESUR 1+ 01/28/2013 1922   PROTEINUR 25 mg/dL 03/28/2013 40/10/2723   NITRITE Negative 01/28/2013 1922   LEUKOCYTESUR Negative 01/28/2013 1922    Radiological Exams on Admission: DG Chest Portable 1 View  Result Date: 03/29/2020 CLINICAL DATA:  Weakness. EXAM: PORTABLE CHEST 1 VIEW COMPARISON:  Chest x-ray 01/28/2013 FINDINGS: The heart size and mediastinal contours are unchanged. Aortic arch calcifications. No focal consolidation. No pulmonary edema. No pleural effusion. No pneumothorax. No acute osseous abnormality. IMPRESSION: No active disease. Electronically Signed   By: 03/28/2013 M.D.   On: 03/29/2020 22:58     Assessment/Plan 87 year old male nursing home resident with history of hypertension and hypothyroidism,  previously in his usual state of health, presenting with a 1 day history of recurrent syncopal episodes.  BP 60/40 with EMS.      Recurrent syncope   Hypotension, with history of hypertension -Syncope likely related to hypotension which in turn likely related to dehydration in combination with antihypertensives -No evidence for sepsis, no stigmata of infection -Follow urinalysis.  Chest x-ray clear -Blood pressure was fluid responsive -Continue IV hydration -Hold antihypertensives -Neurologic checks, fall and aspiration precaution    AKI with metabolic acidosis (acute kidney injury) (HCC) -Creatinine 1.88 with bicarb of 18.  Baseline unknown as no recent labs -Suspect prerenal related dehydration and hypotension -Monitor response to rehydration  Elevated troponin -Troponin of 38 but EKG nonacute and patient denies chest pain -Suspecting demand ischemia related to syncopal episodes    Pancytopenia (HCC) -Leukopenia of 3, baseline unknown -Thrombocytopenia of 70,000 with baseline around 111000 7 years prior -Mild anemia of 10.3 with unknown baseline -Does have history of vitamin B-12 deficiency on documentation from 2014 -Follow  anemia panel -Continue to monitor.  Can consider hematology consult  Hypothyroidism -Check TSH    Alzheimer's dementia (HCC) -Appears stable    DVT prophylaxis: Lovenox  Code Status: full code, pending stepson who would like to review an unsigned advanced directive at the home and speak with patient Family Communication: stepson and POA, Ilona Sorrel  Disposition Plan: Back to previous home environment Consults called: none  Status:.obs    Andris Baumann MD [facility]     03/30/2020, 12:26 AM

## 2020-03-30 NOTE — ED Notes (Signed)
Pt adamant about putting his pants back on. This RN looked in belongings bag and pt pants were wet. Provided paper scrub pants at this time. Pt asking about dentures. No dentures noted to room. No dentures noted to belongings bag.

## 2020-03-30 NOTE — Progress Notes (Signed)
The patient is a 87 yr old man who is a resident of a nursing facility. He was sent to ED due to passing out 3 x on his way to the bathroom. The patient is accompanied by Ilona Sorrel who is the patient's step son. He states that he does not know a lot about how the patient has been done at the nursing home. He states that the patient definitely has dementia. He does not know any details about what happened at the nursing home.   The patient was admitted by my colleague, Dr. Para March early this morning.  The patient is resting comfortably. No new complaints. He has been confused and a little agitated. He is awake, alert, but not oriented. No acute distress. He denies any knowledge of passing out. Heart and lung exam is within normal limits. Abdomen is soft, non-tender, non-distended. Bowel sounds are normal. Lower extremities demonstrate no cyanosis, clubbing, or edema.   Plan is for the patient to have orthostatic vitals and an echocardiogram. More than likely the syncopal episodes were due to combination of low blood volume and vasovagal phenomenon. Echocardiogram has been ordered and is being performed to rule out cardiac cause of syncope. The patient is not able to give me a history of the episodes. He denies that it even happened.   The step son is POA. He has the patient's advanced directive. He has confirmed to nursing that the patient has expressed his desire to be a DNR.

## 2020-03-30 NOTE — ED Notes (Signed)
Family member at bedside.

## 2020-03-31 DIAGNOSIS — F0280 Dementia in other diseases classified elsewhere without behavioral disturbance: Secondary | ICD-10-CM

## 2020-03-31 DIAGNOSIS — N179 Acute kidney failure, unspecified: Secondary | ICD-10-CM

## 2020-03-31 DIAGNOSIS — G301 Alzheimer's disease with late onset: Secondary | ICD-10-CM

## 2020-03-31 LAB — BASIC METABOLIC PANEL
Anion gap: 4 — ABNORMAL LOW (ref 5–15)
BUN: 36 mg/dL — ABNORMAL HIGH (ref 8–23)
CO2: 21 mmol/L — ABNORMAL LOW (ref 22–32)
Calcium: 8.3 mg/dL — ABNORMAL LOW (ref 8.9–10.3)
Chloride: 114 mmol/L — ABNORMAL HIGH (ref 98–111)
Creatinine, Ser: 1.32 mg/dL — ABNORMAL HIGH (ref 0.61–1.24)
GFR, Estimated: 51 mL/min — ABNORMAL LOW (ref >60)
Glucose, Bld: 93 mg/dL (ref 70–99)
Potassium: 5.4 mmol/L — ABNORMAL HIGH (ref 3.5–5.1)
Sodium: 139 mmol/L (ref 135–145)

## 2020-03-31 LAB — URINE CULTURE: Culture: NO GROWTH

## 2020-03-31 LAB — CORTISOL: Cortisol, Plasma: 8.4 ug/dL

## 2020-03-31 LAB — POTASSIUM: Potassium: 4.9 mmol/L (ref 3.5–5.1)

## 2020-03-31 MED ORDER — SODIUM POLYSTYRENE SULFONATE 15 GM/60ML PO SUSP: 30.0000 g | Freq: Once | ORAL | Status: DC

## 2020-03-31 MED ORDER — SODIUM ZIRCONIUM CYCLOSILICATE 10 G PO PACK
10.0000 g | PACK | Freq: Once | ORAL | Status: AC
  Administered 2020-03-31: 10 g via ORAL
  Filled 2020-03-31: qty 1

## 2020-03-31 NOTE — Plan of Care (Signed)
  Problem: Clinical Measurements: Goal: Respiratory complications will improve Outcome: Progressing   Problem: Coping: Goal: Level of anxiety will decrease Outcome: Progressing   Problem: Pain Managment: Goal: General experience of comfort will improve Outcome: Progressing   Problem: Safety: Goal: Ability to remain free from injury will improve Outcome: Progressing   

## 2020-03-31 NOTE — TOC Progression Note (Signed)
Transition of Care [facility]) - Progression Note    Patient Details  Name: Jeffrey Nielsen MRN: [id number] Date of Birth: 05-26-1926  [facility] CM/SW Contact  Hetty Ely, RN Phone Number: 03/31/2020, 12:05 PM  Clinical Narrative:  Called patient's POA, Ilona Sorrel who informed me that patient was a long term resident at [facility] at [place] and he wants him to return. Called Rick at [facility] was told patient can return to previous room C-15-1 when medically stable.          Expected Discharge Plan and Services                                                 Social Determinants of Health (SDOH) Interventions    Readmission Risk Interventions No flowsheet data found.

## 2020-03-31 NOTE — Progress Notes (Signed)
PROGRESS NOTE    Jeffrey Nielsen  QPY:[id number] DOB: March 10, 1926 DOA: 03/29/2020 PCP: System, Provider Not In   Chief Complaint.  Syncope Brief Narrative:  Jeffrey Nielsen is a 87 y.o. male with medical history significant for hypertension and hypothyroidism and occasional falls who was sent to the emergency room because of recurrent syncopal episodes x 1 day.  He had a blood pressure of 60/40 in the ambulance, received fluids.   Assessment & Plan:   Principal Problem:   Recurrent syncope Active Problems:   Alzheimer's dementia (HCC)   Hypertension   Hypotension   Pancytopenia (HCC)   AKI (acute kidney injury) (HCC)   Elevated troponin   Syncope and collapse  #1.  Syncope and collapse. Essential hypertension with hypotension. Demand ischemia secondary to hypotension. Patient blood pressure is stable now.  Hold off all blood pressure medicines. Check cortisol level.  2.  Acute kidney injury. Metabolic acidosis Hyperkalemia. Patient renal function is improving, will discontinue IV fluids. Patient was giving a dose of Lokelma, potassium has normalized Recheck a BMP tomorrow.  #3.  Pancytopenia. Stable, continue to follow.    4.  Alzheimer's dementia.  #5 dysphagia. Consult speech therapy.   DVT prophylaxis: Lovenox Code Status: DNR Family Communication:  Disposition Plan:  .   Status is: Inpatient  Remains inpatient appropriate because:Inpatient level of care appropriate due to severity of illness   Dispo: The patient is from: SNF              Anticipated d/c is to: SNF              Patient currently is not medically stable to d/c.   Difficult to place patient No        I/O last 3 completed shifts: In: 846.2 [I.V.:846.2] Out: 325 [Urine:325] Total I/O In: 480 [P.O.:480] Out: 300 [Urine:300]     Consultants:   None  Procedures: None  Antimicrobials:None  Subjective: Patient is confused, he seems to have decent appetite.  However, he appears  choking on his food.   No short of breath or cough. No abdominal pain or nausea vomiting. No fever chills.   Objective: Vitals:   03/31/20 0000 03/31/20 0615 03/31/20 0833 03/31/20 1215  BP:  (!) 111/95 117/62 121/61  Pulse:  68 70 68  Resp:  19 17 19   Temp: 98 F (36.7 C) 98.3 F (36.8 C) 98.1 F (36.7 C) 97.8 F (36.6 C)  TempSrc: Oral Oral Oral Oral  SpO2:  96% 97% 99%  Weight:      Height:        Intake/Output Summary (Last 24 hours) at 03/31/2020 1404 Last data filed at 03/31/2020 1215 Gross per 24 hour  Intake 1326.21 ml  Output 625 ml  Net 701.21 ml   Filed Weights   03/29/20 2219 03/30/20 1731  Weight: 98 kg 100.5 kg    Examination:  General exam: Appears calm and comfortable  Respiratory system: Clear to auscultation. Respiratory effort normal. Cardiovascular system: S1 & S2 heard, RRR. No JVD, murmurs, rubs, gallops or clicks. No pedal edema. Gastrointestinal system: Abdomen is nondistended, soft and nontender. No organomegaly or masses felt. Normal bowel sounds heard. Central nervous system: Alert and oriented x1. No focal neurological deficits. Extremities: Symmetric 5 x 5 power. Skin: No rashes, lesions or ulcers Psychiatry:  Mood & affect appropriate.     Data Reviewed: I have personally reviewed following labs and imaging studies  CBC: Recent Labs  Lab 03/29/20 2225 03/30/20  0443  WBC 3.0* 2.6*  NEUTROABS 1.9  --   HGB 10.3* 10.1*  HCT 29.8* 28.5*  MCV 108.0* 106.7*  PLT 70* 68*   Basic Metabolic Panel: Recent Labs  Lab 03/29/20 2225 03/30/20 0443 03/31/20 0545 03/31/20 1247  NA 139 137 139  --   K 4.7 5.3* 5.4* 4.9  CL 112* 113* 114*  --   CO2 18* 20* 21*  --   GLUCOSE 132* 135* 93  --   BUN 45* 44* 36*  --   CREATININE 1.88* 1.76* 1.32*  --   CALCIUM 7.7* 8.0* 8.3*  --    GFR: Estimated Creatinine Clearance: 44.8 mL/min (A) (by C-G formula based on SCr of 1.32 mg/dL (H)). Liver Function Tests: No results for input(s): AST,  ALT, ALKPHOS, BILITOT, PROT, ALBUMIN in the last 168 hours. No results for input(s): LIPASE, AMYLASE in the last 168 hours. No results for input(s): AMMONIA in the last 168 hours. Coagulation Profile: No results for input(s): INR, PROTIME in the last 168 hours. Cardiac Enzymes: No results for input(s): CKTOTAL, CKMB, CKMBINDEX, TROPONINI in the last 168 hours. BNP (last 3 results) No results for input(s): PROBNP in the last 8760 hours. HbA1C: No results for input(s): HGBA1C in the last 72 hours. CBG: No results for input(s): GLUCAP in the last 168 hours. Lipid Profile: No results for input(s): CHOL, HDL, LDLCALC, TRIG, CHOLHDL, LDLDIRECT in the last 72 hours. Thyroid Function Tests: Recent Labs    03/30/20 0443  TSH 0.262*   Anemia Panel: Recent Labs    03/29/20 2225  VITAMINB12 280  FOLATE 10.2  FERRITIN 436*  TIBC 210*  IRON 58  RETICCTPCT 1.4   Sepsis Labs: No results for input(s): PROCALCITON, LATICACIDVEN in the last 168 hours.  Recent Results (from the past 240 hour(s))  Resp Panel by RT-PCR (Flu A&B, Covid) Nasopharyngeal Swab     Status: None   Collection Time: 03/29/20 11:05 PM   Specimen: Nasopharyngeal Swab; Nasopharyngeal(NP) swabs in vial transport medium  Result Value Ref Range Status   SARS Coronavirus 2 by RT PCR NEGATIVE NEGATIVE Final    Comment: (NOTE) SARS-CoV-2 target nucleic acids are NOT DETECTED.  The SARS-CoV-2 RNA is generally detectable in upper respiratory specimens during the acute phase of infection. The lowest concentration of SARS-CoV-2 viral copies this assay can detect is 138 copies/mL. A negative result does not preclude SARS-Cov-2 infection and should not be used as the sole basis for treatment or other patient management decisions. A negative result may occur with  improper specimen collection/handling, submission of specimen other than nasopharyngeal swab, presence of viral mutation(s) within the areas targeted by this assay,  and inadequate number of viral copies(<138 copies/mL). A negative result must be combined with clinical observations, patient history, and epidemiological information. The expected result is Negative.  Fact Sheet for Patients:  BloggerCourse.com  Fact Sheet for Healthcare Providers:  SeriousBroker.it  This test is no t yet approved or cleared by the [place] FDA and  has been authorized for detection and/or diagnosis of SARS-CoV-2 by FDA under an Emergency Use Authorization (EUA). This EUA will remain  in effect (meaning this test can be used) for the duration of the COVID-19 declaration under Section 564(b)(1) of the Act, 21 U.S.C.section 360bbb-3(b)(1), unless the authorization is terminated  or revoked sooner.       Influenza A by PCR NEGATIVE NEGATIVE Final   Influenza B by PCR NEGATIVE NEGATIVE Final    Comment: (NOTE) The Xpert  Xpress SARS-CoV-2/FLU/RSV plus assay is intended as an aid in the diagnosis of influenza from Nasopharyngeal swab specimens and should not be used as a sole basis for treatment. Nasal washings and aspirates are unacceptable for Xpert Xpress SARS-CoV-2/FLU/RSV testing.  Fact Sheet for Patients: BloggerCourse.com  Fact Sheet for Healthcare Providers: SeriousBroker.it  This test is not yet approved or cleared by the [place] FDA and has been authorized for detection and/or diagnosis of SARS-CoV-2 by FDA under an Emergency Use Authorization (EUA). This EUA will remain in effect (meaning this test can be used) for the duration of the COVID-19 declaration under Section 564(b)(1) of the Act, 21 U.S.C. section 360bbb-3(b)(1), unless the authorization is terminated or revoked.  Performed at [facility], [address] [place], [place] [address]   Urine culture     Status: None   Collection Time: 03/30/20 12:12 AM   Specimen:  Urine, Random  Result Value Ref Range Status   Specimen Description   Final    URINE, RANDOM Performed at [facility], [address], [place], [place] [address]    Special Requests   Final    NONE Performed at [facility], [address], [place], [place] [address]    Culture   Final    NO GROWTH Performed at [facility], [address] [place], [place] [address]    Report Status 03/31/2020 FINAL  Final         Radiology Studies: DG Chest Portable 1 View  Result Date: 03/29/2020 CLINICAL DATA:  Weakness. EXAM: PORTABLE CHEST 1 VIEW COMPARISON:  Chest x-ray 01/28/2013 FINDINGS: The heart size and mediastinal contours are unchanged. Aortic arch calcifications. No focal consolidation. No pulmonary edema. No pleural effusion. No pneumothorax. No acute osseous abnormality. IMPRESSION: No active disease. Electronically Signed   By: Tish Frederickson M.D.   On: 03/29/2020 22:58        Scheduled Meds: . divalproex  125 mg Oral QHS  . donepezil  10 mg Oral Daily  . levothyroxine  112 mcg Oral Daily   Continuous Infusions: . sodium chloride 75 mL/hr at 03/30/20 2121     LOS: 1 day    Time spent: 25 minutes    Marrion Coy, MD [facility]   To contact the attending provider between 7A-7P or the covering provider during after hours 7P-7A, please log into the web site [email or web address] and access using universal [place] password for that web site. If you do not have the password, please call the hospital operator.  03/31/2020, 2:04 PM

## 2020-04-01 LAB — BASIC METABOLIC PANEL
Anion gap: 5 (ref 5–15)
BUN: 29 mg/dL — ABNORMAL HIGH (ref 8–23)
CO2: 20 mmol/L — ABNORMAL LOW (ref 22–32)
Calcium: 8.3 mg/dL — ABNORMAL LOW (ref 8.9–10.3)
Chloride: 110 mmol/L (ref 98–111)
Creatinine, Ser: 1.24 mg/dL (ref 0.61–1.24)
GFR, Estimated: 55 mL/min — ABNORMAL LOW (ref >60)
Glucose, Bld: 98 mg/dL (ref 70–99)
Potassium: 4.7 mmol/L (ref 3.5–5.1)
Sodium: 135 mmol/L (ref 135–145)

## 2020-04-01 LAB — MAGNESIUM: Magnesium: 1.8 mg/dL (ref 1.7–2.4)

## 2020-04-01 MED ORDER — VITAMIN B-12 1000 MCG PO TABS: 1000.0000 ug | ORAL_TABLET | Freq: Every day | ORAL | Status: DC

## 2020-04-01 MED ORDER — FLUDROCORTISONE ACETATE 0.1 MG PO TABS: 0.1000 mg | ORAL_TABLET | Freq: Every day | ORAL | 0 refills | Status: DC

## 2020-04-01 NOTE — Progress Notes (Signed)
Report given to [facility] via phone at 1425.  Patient discharged to [facility] with all belongings with Connye Burkitt and Tenny Craw from [facility].  VSS.  A+Ox4, Room Air and status DNR.  All care transferred.

## 2020-04-01 NOTE — Progress Notes (Signed)
Report received from [place], RN at 1300.  Patient arrived to [facility] to await for EMS at 1315.

## 2020-04-01 NOTE — TOC Transition Note (Signed)
Transition of Care [facility]) - CM/SW Discharge Note   Patient Details  Name: Jeffrey Nielsen MRN: [id number] Date of Birth: [birth year]  Transition of Care [facility]) CM/SW Contact:  Hetty Ely, RN Phone Number: 04/01/2020, 10:44 AM    Final next level of care: Skilled Nursing Facility Barriers to Discharge: Barriers Resolved   Patient Goals and CMS Choice Patient states their goals for this hospitalization and ongoing recovery are:: To return to [facility].   Choice offered to / list presented to : NA  Discharge Placement                Patient to be transferred to facility by: [facility] Name of family member notified: POA Ilona Sorrel Patient and family notified of of transfer: 03/31/20  Discharge Plan and Services                DME Arranged: N/A DME Agency: NA       HH Arranged: NA HH Agency: NA        Social Determinants of Health (SDOH) Interventions     Readmission Risk Interventions No flowsheet data found.

## 2020-04-01 NOTE — Discharge Summary (Signed)
Physician Discharge Summary  Patient ID: Jeffrey Nielsen MRN: [id number] DOB/AGE: [birth year] 85 y.o.  Admit date: 03/29/2020 Discharge date: 04/01/2020  Admission Diagnoses:  Discharge Diagnoses:  Principal Problem:   Recurrent syncope Active Problems:   Alzheimer's dementia (HCC)   Hypertension   Hypotension   Pancytopenia (HCC)   AKI (acute kidney injury) (HCC)   Elevated troponin   Syncope and collapse   Discharged Condition: fair  Hospital Course: Antron Seth Roeis a 87 y.o.malewith medical history significant forhypertension and hypothyroidismand occasional fallswho was sent to the emergency room because of recurrent syncopal episodes x 1 day.  He had a blood pressure of 60/40 in the ambulance, received fluids.  #1.  Syncope and collapse. Essential hypertension with hypotension. Demand ischemia secondary to hypotension. Patient does not have significant decreased cortisol level, he received fluids, blood pressure has been stabilized.  At this point, I will start Florinef 0.1 mg daily.  2.  Acute kidney injury. Metabolic acidosis Hyperkalemia. Received IV fluids, renal function normalized.  Potassium also normalized.  #3.  Pancytopenia. Patient has borderline a low B12 level, will start B12 supplement.   4.  Alzheimer's dementia.  #5 dysphagia. Will be seen by speech therapy before discharge.  Consults: None  Significant Diagnostic Studies:  PORTABLE CHEST 1 VIEW  COMPARISON:  Chest x-ray 01/28/2013  FINDINGS: The heart size and mediastinal contours are unchanged. Aortic arch calcifications.  No focal consolidation. No pulmonary edema. No pleural effusion. No pneumothorax.  No acute osseous abnormality.  IMPRESSION: No active disease.   Electronically Signed   By: Tish Frederickson M.D.   On: 03/29/2020 22:58   Treatments: IV fluids  Discharge Exam: Blood pressure 132/69, pulse 75, temperature 97.6 F (36.4 C), resp. rate 18, height  6\' 4"  (1.93 m), weight 100.5 kg, SpO2 95 %. General appearance: alert and cooperative Resp: clear to auscultation bilaterally Cardio: regular rate and rhythm, S1, S2 normal, no murmur, click, rub or gallop GI: soft, non-tender; bowel sounds normal; no masses,  no organomegaly Extremities: extremities normal, atraumatic, no cyanosis or edema  Disposition: Discharge disposition: 03-Skilled Nursing Facility       Discharge Instructions    Amb Referral to Palliative Care   Complete by: As directed    Diet - low sodium heart healthy   Complete by: As directed    Increase activity slowly   Complete by: As directed      Allergies as of 04/01/2020   No Known Allergies     Medication List    TAKE these medications   Aloe Vesta Clear Antifungal 2 % Oint Generic drug: Miconazole Nitrate Apply topically.   divalproex 125 MG DR tablet Commonly known as: DEPAKOTE Take 125 mg by mouth at bedtime.   donepezil 10 MG tablet Commonly known as: ARICEPT Take 10 mg by mouth daily.   fludrocortisone 0.1 MG tablet Commonly known as: FLORINEF Take 1 tablet (0.1 mg total) by mouth daily.   levothyroxine 112 MCG tablet Commonly known as: SYNTHROID Take 112 mcg by mouth daily.   lisinopril 20 MG tablet Commonly known as: ZESTRIL Take 20 mg by mouth daily.   loperamide 2 MG tablet Commonly known as: IMODIUM A-D Take 2 mg by mouth 4 (four) times daily as needed for diarrhea or loose stools. Take after each loose stool, not to exceed 8 mg in 24 hours.   vitamin B-12 1000 MCG tablet Commonly known as: CYANOCOBALAMIN Take 1 tablet (1,000 mcg total) by mouth daily.  34 minutes  Signed: Marrion Coy 04/01/2020, 9:45 AM

## 2020-04-01 NOTE — Evaluation (Signed)
Clinical/Bedside Swallow Evaluation Patient Details  Name: Jeffrey Nielsen MRN: [id number] Date of Birth: [birth year]  Today's Date: 04/01/2020 Time: SLP Start Time (ACUTE ONLY): 0910 SLP Stop Time (ACUTE ONLY): 1005 SLP Time Calculation (min) (ACUTE ONLY): 55 min  Past Medical History:  Past Medical History:  Diagnosis Date  . HTN (hypertension)   . Hypothyroidism    Past Surgical History:  Past Surgical History:  Procedure Laterality Date  . CERVICAL DISC SURGERY     HPI:  Pt is a 87 y.o. male with medical history significant for Dementia, hypertension and hypothyroidism and occasional falls who was sent to the emergency room because of recurrent syncopal episodes x 1 day.  Patient was previously in his usual state of health.  He has had no recent illness, no cough, shortness of breath, fever or chills.  He has had no vomiting or diarrhea, abdominal pain and no dysuria.  History is limited due to dementia.  Pt lives at [facility].  On arrival of EMS, BP was reportedly 60/40.  CXR imaging: negative.   Assessment / Plan / Recommendation Clinical Impression  Pt appears to present w/ adequate oropharyngeal phase swallow w/ No oropharyngeal phase dysphagia noted, No neuromuscular deficits noted. Pt consumed po trials w/ No overt, clinical s/s of aspiration during po trials. Pt does have Baseline Dementia which can impact his overall awareness/engagement w/ po tasks which can increase risk for choking/aspiration. Though Cognitive decline is present, the risk for aspiration can be reduced by following general aspiration precautions and given meal setup. Also recommend REDUCING DISTRACTIONS during po intake for safer swallowing and improved focus to po tasks. Pt is also Missing Bottom Dentition/Dentures which impacts effectiveness of mastication.  Pt was better positioned upright in bed (more upright) then supported w/ tray setup. He fed himself trials of thin liquids, purees, and soft solids from his Breakfast  meal w/ No overt clinical s/s of aspiration noted; no decline in phonations, no cough, and no decline in respiratory status during/post trials. Oral phase was grossly [facility] for bolus management and oral clearing of the boluses given; mastication Time was increased for the increased textured foods d/t mashing/gumming of the solids. Encouraged lingual sweeping to clear orally b/t bites. Discussed diet consistency of foods/thin liquids; aspiration precautions; pills Whole vs Crushed in puree; tray setup and reducing distractions at meals. Handouts posted in room, chart. Recommend a more Dysphagia level 3/Regular diet(mech soft, Minced meats) w/ Thin liquids; general aspiration precautions; reduce Distractions during meals. Pills Crushed in Puree as needed for safer swallowing. Support w/ setup at all meals d/t Cognitive decline/Dementia; offer drink supplements to support nutrition as needed. NSG/MD updated. NSG to reconsult ST services if any new needs arise while admitted. SLP Visit Diagnosis: Dysphagia, unspecified (R13.10) (missing bottom Dentition impacting mastication)    Aspiration Risk   (reduced following precautions)    Diet Recommendation  Mech Soft/Regular -- Cut meats, tougher foods(d/t missing lower Denture plate); Thin liquids. General aspiration precautions. Tray setup.   Medication Administration: Whole meds with puree (vs Crushed if needed for safer, easier swallowing)    Other  Recommendations Recommended Consults:  (n/a at this time) Oral Care Recommendations: Oral care BID;Staff/trained caregiver to provide oral care Other Recommendations:  (n/a)   Follow up Recommendations None      Frequency and Duration  (n/a)   (n/a)       Prognosis Prognosis for Safe Diet Advancement: Good Barriers to Reach Goals: Cognitive deficits;Time post onset;Severity of deficits  Swallow Study   General Date of Onset: 03/29/20 HPI: Pt is a 88 y.o. male with medical history significant for  Dementia, hypertension and hypothyroidism and occasional falls who was sent to the emergency room because of recurrent syncopal episodes x 1 day.  Patient was previously in his usual state of health.  He has had no recent illness, no cough, shortness of breath, fever or chills.  He has had no vomiting or diarrhea, abdominal pain and no dysuria.  History is limited due to dementia.  Pt lives at [facility].  On arrival of EMS, BP was reportedly 60/40.  CXR imaging: negative. Type of Study: Bedside Swallow Evaluation Previous Swallow Assessment: none noted Diet Prior to this Study: Regular;Thin liquids Temperature Spikes Noted: No (wbc 2.6) Respiratory Status: Room air History of Recent Intubation: No Behavior/Cognition: Alert;Cooperative;Pleasant mood;Confused;Distractible;Requires cueing (Dementia baseline) Oral Cavity Assessment: Within Functional Limits Oral Care Completed by SLP: Yes Oral Cavity - Dentition: Dentures, top (no bottom dentition or plate) Vision: Functional for self-feeding Self-Feeding Abilities: Able to feed self;Needs assist;Needs set up Patient Positioning: Upright in bed (needed some positioning to sit upright) Baseline Vocal Quality: Normal Volitional Cough: Strong Volitional Swallow: Able to elicit    Oral/Motor/Sensory Function Overall Oral Motor/Sensory Function: Within functional limits   Ice Chips Ice chips: Not tested   Thin Liquid Thin Liquid: Within functional limits Presentation: Self Fed;Straw;Cup (~4 ozs total) Other Comments: water, milk, coffee left    Nectar Thick Nectar Thick Liquid: Not tested   Honey Thick Honey Thick Liquid: Not tested   Puree Puree: Within functional limits Presentation: Self Fed;Spoon (4 trials)   Solid     Solid: Impaired Presentation: Self Fed;Spoon (8-9+ trials) Oral Phase Impairments: Impaired mastication (missing bottom dentition) Oral Phase Functional Implications: Impaired mastication (time needed) Pharyngeal Phase  Impairments:  (none)       Jerilynn Som, MS, [employer] Speech Language Pathologist Rehab Services [phone] [facility] 04/01/2020,11:28 AM

## 2020-04-11 ENCOUNTER — Non-Acute Institutional Stay: Payer: Self-pay | Admitting: Primary Care

## 2020-04-11 ENCOUNTER — Other Ambulatory Visit: Payer: Self-pay

## 2020-04-11 DIAGNOSIS — R55 Syncope and collapse: Secondary | ICD-10-CM

## 2020-04-11 DIAGNOSIS — F0280 Dementia in other diseases classified elsewhere without behavioral disturbance: Secondary | ICD-10-CM

## 2020-04-11 DIAGNOSIS — Z515 Encounter for palliative care: Secondary | ICD-10-CM

## 2020-04-11 NOTE — Progress Notes (Addendum)
[facility] Consult Note Telephone: [phone]  Fax: [phone]    Date of encounter: 04/11/20 PATIENT NAME: Jeffrey Nielsen [place] [id number] [phone] (home)  DOB: Jul 27, 1926 MRN: [id number]  PRIMARY CARE PROVIDER:    Alvester Morin, MD [place]. Jeffrey Nielsen [place] [address] [phone]   REFERRING PROVIDER:   Alvester Morin, MD [place]. Jeffrey Nielsen [place] [address] [phone]  RESPONSIBLE PARTY:   Extended Emergency Contact Information Primary Emergency Contact: Jeffrey Nielsen Address: [address]          [place], [place] [address] Home Phone: [phone] Relation: Son  I met face to face with patient  In facility. Palliative Care was asked to follow this patient by consultation request of Jeffrey Morin, MD  to help address advance care planning and goals of care. This is the initial  visit.  ASSESSMENT AND RECOMMENDATIONS:   1. Advance Care Planning/Goals of Care: Goals include to maximize quality of life and symptom management.  Patient with DNR on file. Attempted to reach POA, no answer, no ability to leave message  2. Symptom Management:   I met with patient in his nursing home room. He was in bed resting. He was interactive and pleasant. He states that he is very comfortable there that he sleeps well at night;  he enjoys his meals and can get up by himself. He states in the evening he sits up for a while watching TV and usually goes to bed by 8 o'clock. He states that he's lived there for several months.   I attempted to reach his son on the provided number. The number had no voicemail set up and no way to leave a message. I will try to call it a later time.Staff states that he is comfortable has been a resident for a while and it's not complaining of increased pain or other discomfort.  3. Follow up Palliative Care Visit: Palliative  care will continue to follow for goals of care clarification and symptom management. Return 4 weeks or prn.  I spent 25 minutes providing this consultation,  from 1500 to 1525. More than 50% of the time in this consultation was spent in counseling and care coordination.  CODE STATUS: DNR  PPS: 40%  HOSPICE ELIGIBILITY/DIAGNOSIS: TBD  Subjective:  CHIEF COMPLAINT: debility  HISTORY OF PRESENT ILLNESS:  Jeffrey Nielsen is a 87 y.o. year old male  with AD, debility, mobility impairment .   We are asked to consult around advance care planning and complex medical decision making.    Review and summarization of old Epic records shows or history from other than patient.  Review or lab tests, radiology,  or medicine. Labs from recent ED   History obtained from review of EMR, discussion with primary team, and  interview with family, caregiver  and/or Jeffrey Nielsen. Records reviewed and summarized above.   CURRENT PROBLEM LIST:  Patient Active Problem List   Diagnosis Date Noted  . Elevated troponin 03/30/2020  . Syncope and collapse 03/30/2020  . Alzheimer's dementia ([place]) 03/29/2020  . Hypertension 03/29/2020  . Hypotension 03/29/2020  . Recurrent syncope 03/29/2020  . Pancytopenia ([place]) 03/29/2020  . AKI (acute kidney injury) ([place]) 03/29/2020   PAST MEDICAL HISTORY:  Active Ambulatory Problems    Diagnosis Date Noted  . Alzheimer's dementia ([place]) 03/29/2020  . Hypertension 03/29/2020  . Hypotension 03/29/2020  . Recurrent syncope 03/29/2020  . Pancytopenia ([place]) 03/29/2020  . AKI (  acute kidney injury) ([place]) 03/29/2020  . Elevated troponin 03/30/2020  . Syncope and collapse 03/30/2020   Resolved Ambulatory Problems    Diagnosis Date Noted  . No Resolved Ambulatory Problems   Past Medical History:  Diagnosis Date  . HTN (hypertension)   . Hypothyroidism    SOCIAL HX:  Social History   Tobacco Use  . Smoking status: Never Smoker  . Smokeless tobacco: Never Used  Substance  Use Topics  . Alcohol use: Not Currently   FAMILY HX: No family history on file.    ALLERGIES: No Known Allergies   PERTINENT MEDICATIONS:  Outpatient Encounter Medications as of 04/11/2020  Medication Sig  . ALOE VESTA CLEAR ANTIFUNGAL 2 % OINT Apply topically.  . divalproex (DEPAKOTE) 125 MG DR tablet Take 125 mg by mouth at bedtime.  . donepezil (ARICEPT) 10 MG tablet Take 10 mg by mouth daily.  . fludrocortisone (FLORINEF) 0.1 MG tablet Take 1 tablet (0.1 mg total) by mouth daily.  Marland Kitchen levothyroxine (SYNTHROID) 112 MCG tablet Take 112 mcg by mouth daily.  Marland Kitchen lisinopril (ZESTRIL) 20 MG tablet Take 20 mg by mouth daily.  Marland Kitchen loperamide (IMODIUM A-D) 2 MG tablet Take 2 mg by mouth 4 (four) times daily as needed for diarrhea or loose stools. Take after each loose stool, not to exceed 8 mg in 24 hours.  . vitamin B-12 (CYANOCOBALAMIN) 1000 MCG tablet Take 1 tablet (1,000 mcg total) by mouth daily.   No facility-administered encounter medications on file as of 04/11/2020.    Objective: ROS   General: NAD EYES: denies vision changes, wears glasses ENMT: denies dysphagia Cardiovascular: denies chest pain Pulmonary: denies  cough, denies increased SOB Abdomen: endorses good appetite, denies constipation, endorses continence of bowel GU: denies dysuria, endorses continence of urine MSK:  endorses ROM limitations, no falls reported Skin: denies rashes or wounds Neurological: endorses weakness, denies pain, denies insomnia Psych: Endorses positive mood Heme/lymph/immuno: denies bruises, abnormal bleeding  Physical Exam: Current and past weights:221 lb per Epic now; 209 lbs per SNF Constitutional: NAD General: frail appearing, WNWD EYES: anicteric sclera, lids intact, no discharge  ENMT: intact hearing,oral mucous membranes moist, dentition intact Pulmonary no increased work of breathing, no cough, no audible wheezes, room air Abdomen: intake 100%,  no ascites GU: deferred MSK: mild  sarcopenia, decreased ROM in all extremities, no contractures of LE, non ambulatory Skin: warm and dry, no rashes or wounds on visible skin Neuro: Generalized weakness,+  cognitive impairment Psych: non-anxious affect, A and O x 2 Hem/lymph/immuno: no widespread bruising  Thank you for the opportunity to participate in the care of Mr. Milliman.  The palliative care team will continue to follow. Please call our office at [phone] if we can be of additional assistance.  Jason Coop, NP , DNP, MPH, AGPCNP-BC, ACHPN   COVID-19 PATIENT SCREENING TOOL  Person answering questions: _______staff____________   1.  Is the patient or any family member in the home showing any signs or symptoms regarding respiratory infection?                  Person with Symptom  ______________na___________ a. Fever/chills/headache                                                        Yes___ No__X_  b. Shortness of breath                                                            Yes___ No__X_           c. Cough/congestion                                               Yes___  No__X_          d. Muscle/Body aches/pains                                                   Yes___ No__X_         e. Gastrointestinal symptoms (diarrhea,nausea)             Yes___ No__X_         f. Sudden loss of smell or taste      Yes___ No__X_        2. Within the past 10 days, has anyone living in the home had any contact with someone with or under investigation for COVID-19?    Yes___ No__X__   Person __________________

## 2020-05-07 ENCOUNTER — Other Ambulatory Visit: Payer: Self-pay

## 2020-05-07 ENCOUNTER — Non-Acute Institutional Stay: Payer: Medicare Other | Admitting: Primary Care

## 2020-05-07 VITALS — Ht 76.0 in | Wt 210.0 lb

## 2020-05-07 DIAGNOSIS — R54 Age-related physical debility: Secondary | ICD-10-CM | POA: Insufficient documentation

## 2020-05-07 DIAGNOSIS — Z515 Encounter for palliative care: Secondary | ICD-10-CM

## 2020-05-07 DIAGNOSIS — Z7409 Other reduced mobility: Secondary | ICD-10-CM | POA: Insufficient documentation

## 2020-05-07 NOTE — Progress Notes (Signed)
[facility] Consult Note Telephone: [phone]  Fax: [phone]    Date of encounter: 05/07/20 PATIENT NAME: Jeffrey Nielsen [place] [id number]   [phone] (home)  DOB: August 19, 1926 MRN: [id number] PRIMARY CARE PROVIDER:    Alvester Morin, MD,  [place]. [place] [address] [phone]  REFERRING PROVIDER:   Alvester Morin, MD [place]. [place],  [place] [address] [phone]  RESPONSIBLE PARTY:    Contact Information    Name Relation Home Work Mobile   [place] Son [phone]         I [facility] in [facility]. Palliative Care was asked to follow this patient by consultation request of  Slade-Hartman, Ivette Loyal* to address advance care planning and complex medical decision making. This is a follow up visit.    ASSESSMENT AND PLAN / RECOMMENDATIONS:   Advance Care Planning/Goals of Care: Goals include to maximize quality of life and symptom management. Our advance care planning conversation included a discussion about:     The value and importance of advance care planning   Experiences with loved ones who have been seriously ill or have died - pt wife died and son discussed. She is buried in [place].  Son  had a bad experience with palliative care in the hospital.  Son has spoken with patient RE burial. Has pre planning done with [facility].  Exploration of personal, cultural or spiritual beliefs that might influence medical decisions   Exploration of goals of care in the event of a sudden injury or illness   Identification of a healthcare agent - Son   CODE STATUS: DNR  Son discussed his DNR decision while in the hospital.  Discussed patient 's situation of not getting along with roommate.  They are seeking a private room once the room renovations are done. He has had a private room in the past, and likes  this and keeps to himself. Family/ step son has requested a private room again for his comfort. Son brings in snacks for him frequently. Discussed frustrations with responsibility.   Symptom Management/Plan:  Dehydration:  Visited with patient.  Has had  Hospital stay for dehydration in March. Family requests he has water in his room. Recommend water at bedside, to avoid dehydration.He needs to be hydrated for bp management.  He has just eaten lunch today and has a good intake.. Patient states his son brings him snacks and he enjoys these, although son says he forgets and should be prompted to drink the extra fluids.  Mobility: Endorses being able to transfer from bed to chair. He sits up most days for a while but returns to bed after supper and sleeps by about 8 pm. 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   Follow up Palliative Care Visit: Palliative care will continue to follow for complex medical decision making, advance care planning, and clarification of goals. Return 6-8 weeks or prn.  I spent 35 minutes providing this consultation. More than 50% of the time in this consultation was spent in counseling and care coordination.  PPS: 40%  HOSPICE ELIGIBILITY/DIAGNOSIS: TBD  Chief Complaint: debility, frailty  HISTORY OF PRESENT ILLNESS:  Jeffrey Nielsen is a 87 y.o. year old male  with h/o dehydration, dementia, immobility, frailty .   History obtained from review of EMR, discussion with primary team, and interview  with family, facility staff/caregiver and/or Mr. Gervacio.  I reviewed available labs, medications, imaging, studies and related documents from the EMR.  Records reviewed and summarized above.   ROS  General: NAD ENMT: denies dysphagia Pulmonary: denies cough, denies increased SOB Abdomen: endorses good appetite, denies constipation MSK:  endorses weakness,  no falls reported Skin: denies rashes or  wounds Neurological: denies pain, denies insomnia Psych: Endorses positive mood  Physical Exam: Current and past weights: 210 lbs Constitutional: NAD General: frail appearingEYES: anicteric sclera, lids intact, no discharge  ENMT: intact hearing, oral mucous membranes moist, ill fitting dentures Pulmonary: no increased work of breathing, no cough, room air Abdomen: intake 50-75%, no ascites MSK: mod sarcopenia, moves all extremities, non ambulatory, uses w/c. Skin: warm and dry, no rashes or wounds on visible skin Neuro:  ++ generalized weakness,  ++ cognitive impairment Psych: non-anxious affect, A and O x 1-2  Thank you for the opportunity to participate in the care of Mr. Glazer.  The palliative care team will continue to follow. Please call our office at [phone] if we can be of additional assistance.   Jason Coop, NP , DNP, MPH, AGPCNP-BC, ACHPN  COVID-19 PATIENT SCREENING TOOL Asked and negative response unless otherwise noted:   Have you had symptoms of covid, tested positive or been in contact with someone with symptoms/positive test in the past 5-10 days?

## 2020-07-25 ENCOUNTER — Other Ambulatory Visit: Payer: Self-pay

## 2020-07-25 ENCOUNTER — Non-Acute Institutional Stay: Payer: Medicare Other | Admitting: Primary Care

## 2020-07-25 DIAGNOSIS — R54 Age-related physical debility: Secondary | ICD-10-CM

## 2020-07-25 DIAGNOSIS — Z7409 Other reduced mobility: Secondary | ICD-10-CM

## 2020-07-25 DIAGNOSIS — G308 Other Alzheimer's disease: Secondary | ICD-10-CM

## 2020-07-25 DIAGNOSIS — Z515 Encounter for palliative care: Secondary | ICD-10-CM

## 2020-07-25 DIAGNOSIS — F0280 Dementia in other diseases classified elsewhere without behavioral disturbance: Secondary | ICD-10-CM
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
## 2020-09-19 ENCOUNTER — Non-Acute Institutional Stay: Payer: Medicare Other | Admitting: Primary Care

## 2020-09-19 ENCOUNTER — Other Ambulatory Visit: Payer: Self-pay

## 2020-10-22 ENCOUNTER — Non-Acute Institutional Stay: Payer: Medicare Other | Admitting: Primary Care

## 2020-10-22 ENCOUNTER — Other Ambulatory Visit: Payer: Self-pay

## 2020-10-22 DIAGNOSIS — Z515 Encounter for palliative care: Secondary | ICD-10-CM

## 2020-10-22 DIAGNOSIS — Z7409 Other reduced mobility: Secondary | ICD-10-CM

## 2020-10-22 DIAGNOSIS — R54 Age-related physical debility: Secondary | ICD-10-CM

## 2020-10-22 DIAGNOSIS — F0280 Dementia in other diseases classified elsewhere without behavioral disturbance: Secondary | ICD-10-CM

## 2020-10-22 NOTE — Patient Instructions (Signed)
[facility] Consult Note Telephone: [phone]  Fax: [phone]    Date of encounter: 10/22/20 1:57 PM PATIENT NAME: Jeffrey Nielsen [place] [id number]   [phone] (home)  DOB: 12-10-1926 MRN: [id number] PRIMARY CARE PROVIDER:    Alvester Morin, MD,  '[email or web address]' @ [phone]  REFERRING PROVIDER:   Alvester Morin, MD [phone] [address]. [place],  [place] [address] [phone]  RESPONSIBLE PARTY:    Contact Information     Name Relation Home Work Mobile   [place] Son [phone]          I met face to face with patient in [facility]. Palliative Care was asked to follow this patient by consultation request of  Slade-Hartman, Ivette Loyal* to address advance care planning and complex medical decision making. This is a follow up visit.                                   ASSESSMENT AND PLAN / RECOMMENDATIONS:   Advance Care Planning/Goals of Care: Goals include to maximize quality of life and symptom management. Our advance care planning conversation included a discussion about:    The value and importance of advance care planning  Experiences with loved ones who have been seriously ill or have died  Exploration of personal, cultural or spiritual beliefs that might influence medical decisions  Exploration of goals of care in the event of a sudden injury or illness  Identification and preparation of a healthcare agent  Review and updating or creation of an  advance directive document . Decision not to resuscitate or to de-escalate disease focused treatments due to poor prognosis. CODE STATUS:  Symptom Management/Plan:    Follow up Palliative Care Visit: Palliative care will continue to follow for complex medical decision making, advance care planning, and clarification of goals. Return 6-8 weeks or prn.  I spent 25 minutes providing this consultation. More than 50% of  the time in this consultation was spent in counseling and care coordination.   PPS: 40%  HOSPICE ELIGIBILITY/DIAGNOSIS: no  Chief Complaint: debility, immobility  HISTORY OF PRESENT ILLNESS:  HERVEY WEDIG is a 87 y.o. year old male  with dementia, fall risk , HOH, debility, immobility.  History obtained from review of EMR, discussion with primary team, and interview with family, facility staff/caregiver and/or Jeffrey Nielsen.  I reviewed available labs, medications, imaging, studies and related documents from the EMR.  Records reviewed and summarized above.   ROS/staff   General: NAD EYES: denies vision changes ENMT: denies dysphagia Cardiovascular: denies chest pain, denies DOE Pulmonary: denies cough, denies increased SOB Abdomen: endorses good appetite, denies constipation, endorses incontinence of bowel GU: denies dysuria, endorses incontinence of urine MSK:  endorses weakness,  no falls reported Skin: denies rashes or wounds Neurological: denies pain, denies insomnia Psych: Endorses positive mood Heme/lymph/immuno: denies bruises, abnormal bleeding  Physical Exam: Current and past weights: Constitutional: NAD 115/74 HR 68 RR 18 General: frail appearing, obese  EYES: anicteric sclera, lids intact, no discharge  ENMT: intact hearing, oral mucous membranes moist, dentition intact CV: S1S2, RRR, 1-2+ bil LE edema Pulmonary: LCTA, no increased work of breathing, no cough, room air Abdomen: intake 100%, normo-active BS + 4 quadrants, soft and non tender, no ascites GU: deferred MSK: mild  sarcopenia, moves all extremities, non ambulatory, mobilizes in wheel chair. Skin: warm and dry, no rashes or wounds on  visible skin Neuro:  +generalized weakness,  +cognitive impairment Psych: at times anxious affect, A and O x 2 Hem/lymph/immuno: no widespread bruising  Outpatient Encounter Medications as of 10/22/2020  Medication Sig   ALOE VESTA CLEAR ANTIFUNGAL 2 % OINT Apply topically.    divalproex (DEPAKOTE) 125 MG DR tablet Take 125 mg by mouth 2 (two) times daily.   donepezil (ARICEPT) 10 MG tablet Take 10 mg by mouth daily.   fludrocortisone (FLORINEF) 0.1 MG tablet Take 1 tablet (0.1 mg total) by mouth daily.   levothyroxine (SYNTHROID) 112 MCG tablet Take 112 mcg by mouth daily.   lisinopril (ZESTRIL) 20 MG tablet Take 20 mg by mouth daily.   loperamide (IMODIUM A-D) 2 MG tablet Take 2 mg by mouth 4 (four) times daily as needed for diarrhea or loose stools. Take after each loose stool, not to exceed 8 mg in 24 hours.   vitamin B-12 (CYANOCOBALAMIN) 1000 MCG tablet Take 1 tablet (1,000 mcg total) by mouth daily.   No facility-administered encounter medications on file as of 10/22/2020.     Thank you for the opportunity to participate in the care of Jeffrey Nielsen.  The palliative care team will continue to follow. Please call our office at [phone] if we can be of additional assistance.   Jason Coop, NP   COVID-19 PATIENT SCREENING TOOL Asked and negative response unless otherwise noted:   Have you had symptoms of covid, tested positive or been in contact with someone with symptoms/positive test in the past 5-10 days?
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
## 2021-01-19 ENCOUNTER — Non-Acute Institutional Stay: Payer: Medicare Other | Admitting: Primary Care

## 2021-01-19 ENCOUNTER — Other Ambulatory Visit: Payer: Self-pay

## 2021-01-19 DIAGNOSIS — R54 Age-related physical debility: Secondary | ICD-10-CM

## 2021-01-19 DIAGNOSIS — Z7409 Other reduced mobility: Secondary | ICD-10-CM

## 2021-01-19 DIAGNOSIS — Z515 Encounter for palliative care: Secondary | ICD-10-CM
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
## 2021-03-02 ENCOUNTER — Other Ambulatory Visit: Payer: Medicare Other | Admitting: Primary Care

## 2021-03-02 ENCOUNTER — Other Ambulatory Visit: Payer: Self-pay

## 2021-03-02 DIAGNOSIS — R54 Age-related physical debility: Secondary | ICD-10-CM

## 2021-03-02 DIAGNOSIS — G308 Other Alzheimer's disease: Secondary | ICD-10-CM

## 2021-03-02 DIAGNOSIS — F02B3 Dementia in other diseases classified elsewhere, moderate, with mood disturbance: Secondary | ICD-10-CM

## 2021-03-02 DIAGNOSIS — Z7409 Other reduced mobility: Secondary | ICD-10-CM

## 2021-03-02 DIAGNOSIS — Z515 Encounter for palliative care: Secondary | ICD-10-CM

## 2021-03-02 NOTE — Progress Notes (Signed)
[facility] Consult Note Telephone: [phone]  Fax: [phone]    Date of encounter: 03/02/21 3:56 PM PATIENT NAME: Jeffrey Nielsen [place] [id number]   [phone] (home)  DOB: [birth year] MRN: [id number] PRIMARY CARE PROVIDER:    Alvester Morin, MD,  [place]. [place] [address] [phone]  REFERRING PROVIDER:   Alvester Morin, MD [address]. [place],  [place] [address] [phone]  RESPONSIBLE PARTY:    Contact Information     Name Relation Home Work Mobile   [place] Son [phone]          I met face to face with patient in [facility]. Palliative Care was asked to follow this patient by consultation request of  Slade-Hartman, Ivette Loyal* to address advance care planning and complex medical decision making. This is a follow up visit.                                   ASSESSMENT AND PLAN / RECOMMENDATIONS:   Advance Care Planning/Goals of Care: Goals include to maximize quality of life and symptom management. Patient/health care surrogate gave his/her permission to discuss.Our advance care planning conversation included a discussion about:    Exploration of personal, cultural or spiritual beliefs that might influence medical decisions  Identification of a healthcare agent - step son CODE STATUS: DNR  Symptom Management/Plan:  I met with  the patient in his nursing home room. He was just finishing lunch. He was not able to tell me what he had for lunch other than potatoes. He said his roommate moved out this morning. He states he is doing well. He is does have edema with stasis ulcers on his lower extremities but denies pain or discomfort. He stays in a wheelchair for mobility and has no current pain complaints.  Follow up Palliative Care Visit: Palliative care will continue to follow for complex medical decision making, advance care  planning, and clarification of goals. Return 8 weeks or prn.  This visit was coded based on medical decision making (MDM).  PPS: 40%  HOSPICE ELIGIBILITY/DIAGNOSIS: TBD  Chief Complaint: debility  HISTORY OF PRESENT ILLNESS:  Jeffrey Nielsen is a 88 y.o. year old male  with dementia, immobility, LE edema and wounds .   History obtained from review of EMR, discussion with primary team, and interview with family, facility staff/caregiver and/or Mr. Sanden.  I reviewed available labs, medications, imaging, studies and related documents from the EMR.  Records reviewed and summarized above.   ROS Aldean Ast  General: NAD ENMT: denies dysphagia Cardiovascular: denies chest pain, denies DOE Pulmonary: denies cough, denies increased SOB Abdomen: endorses good appetite, denies constipation, endorses incontinence of bowel GU: denies dysuria, endorses incontinence of urine MSK:  denies  increased weakness,  no falls reported Skin: denies rashes or wounds, found on LE Neurological: denies pain, denies insomnia Psych: Endorses positive mood Heme/lymph/immuno: denies bruises, abnormal bleeding  Physical Exam: Current and past weights: 231 lbs, stable Constitutional: NAD General: frail appearing,  wnwd EYES: anicteric sclera, lids intact, no discharge  ENMT: intact hearing, oral mucous membranes moist, dentition intact CV: 3+ bil LE edema Pulmonary:  no increased work of breathing, no cough, room air Abdomen: intake 100%,  MSK:+sarcopenia, moves all extremities, non  ambulatory Skin: warm and dry, dressed LE wound, heel ulcer noted on chart Neuro:  no generalized weakness,  no  cognitive impairment Psych: non-anxious affect, A and O x 1-2 Hem/lymph/immuno: no widespread bruising   Thank you for the opportunity to participate in the care of Jeffrey Nielsen.  The palliative care team will continue to follow. Please call our office at [phone] if we can be of additional assistance.   Jason Coop, NP DNP, AGPCNP-BC  COVID-19 PATIENT SCREENING TOOL Asked and negative response unless otherwise noted:   Have you had symptoms of covid, tested positive or been in contact with someone with symptoms/positive test in the past 5-10 days?

## 2021-04-06 ENCOUNTER — Other Ambulatory Visit: Payer: Self-pay

## 2021-04-06 ENCOUNTER — Other Ambulatory Visit (INDEPENDENT_AMBULATORY_CARE_PROVIDER_SITE_OTHER): Payer: Self-pay | Admitting: Family Medicine

## 2021-04-06 ENCOUNTER — Encounter (INDEPENDENT_AMBULATORY_CARE_PROVIDER_SITE_OTHER): Payer: Self-pay | Admitting: Vascular Surgery

## 2021-04-06 ENCOUNTER — Ambulatory Visit (INDEPENDENT_AMBULATORY_CARE_PROVIDER_SITE_OTHER): Payer: Medicare Other | Admitting: Vascular Surgery

## 2021-04-06 ENCOUNTER — Ambulatory Visit (INDEPENDENT_AMBULATORY_CARE_PROVIDER_SITE_OTHER): Payer: Medicare Other

## 2021-04-06 VITALS — BP 132/81 | HR 68 | Resp 16

## 2021-04-06 DIAGNOSIS — I7025 Atherosclerosis of native arteries of other extremities with ulceration: Secondary | ICD-10-CM | POA: Diagnosis not present

## 2021-04-06 DIAGNOSIS — I10 Essential (primary) hypertension: Secondary | ICD-10-CM | POA: Diagnosis not present

## 2021-04-06 DIAGNOSIS — L89614 Pressure ulcer of right heel, stage 4: Secondary | ICD-10-CM

## 2021-04-06 DIAGNOSIS — G308 Other Alzheimer's disease: Secondary | ICD-10-CM

## 2021-04-06 DIAGNOSIS — F02B3 Dementia in other diseases classified elsewhere, moderate, with mood disturbance: Secondary | ICD-10-CM

## 2021-04-06 DIAGNOSIS — I739 Peripheral vascular disease, unspecified: Secondary | ICD-10-CM

## 2021-04-06 NOTE — H&P (View-Only) (Signed)
? ? ?MRN : [id number] ? ?Jeffrey Nielsen is a 88 y.o. (July 13, 1926) male who presents with chief complaint of right heel ulcer. ? ?History of Present Illness:  ? ?The patient is seen for evaluation of painful lower extremities and diminished pulses associated with ulceration of the foot.  The patient notes the ulcer has been present for multiple weeks and has not been improving.  It is very painful and has had some drainage.  No specific history of trauma noted by the patient.  The patient denies fever or chills.  the patient does have diabetes which has been difficult to control. ? ?Patient notes prior to the ulcer developing the extremities were painful particularly with ambulation or activity and the discomfort is very consistent day today. Typically, the pain occurs at less than one block, progress is as activity continues to the point that the patient must stop walking. Resting including standing still for several minutes allowed resumption of the activity and the ability to walk a similar distance before stopping again. Uneven terrain and inclined shorten the distance. The pain has been progressive over the past several years.  ? ?The patient denies rest pain or dangling of an extremity off the side of the bed during the night for relief. ?No prior interventions or surgeries. ? ?No history of back problems or DJD of the lumbar sacral spine.  ? ?The patient denies amaurosis fugax or recent TIA symptoms. There are no recent neurological changes noted. ?The patient denies history of DVT, PE or superficial thrombophlebitis. ?The patient denies recent episodes of angina or shortness of breath.   ? ?ABI's Rt=[place] Rt TBI=1.15 and Lt=[place] and Lt TBI=1.22 biphasic signals throughout. ? ?Current Meds  ?Medication Sig  ? ALOE VESTA CLEAR ANTIFUNGAL 2 % OINT Apply 2 g topically 3 (three) times daily.  ? Amino Acids-Protein Hydrolys (PRO-STAT AWC) LIQD Take by mouth daily.  ? ascorbic acid (VITAMIN C) 500 MG tablet Take 500 mg by  mouth daily.  ? divalproex (DEPAKOTE) 125 MG DR tablet Take 125 mg by mouth 2 (two) times daily.  ? donepezil (ARICEPT) 10 MG tablet Take 10 mg by mouth daily.  ? fludrocortisone (FLORINEF) 0.1 MG tablet Take 1 tablet (0.1 mg total) by mouth daily.  ? levothyroxine (SYNTHROID) 112 MCG tablet Take 112 mcg by mouth daily.  ? lisinopril (ZESTRIL) 20 MG tablet Take 20 mg by mouth daily.  ? sodium hypochlorite (DAKIN'S 1/2 STRENGTH) external solution Apply 1 application topically daily.  ? vitamin B-12 (CYANOCOBALAMIN) 1000 MCG tablet Take 1 tablet (1,000 mcg total) by mouth daily.  ? Wound Dressings (MEDIHONEY WOUND/BURN DRESSING) PSTE Apply 1 application topically daily.  ? zinc sulfate 220 (50 Zn) MG capsule Take 220 mg by mouth daily.  ? ? ?Past Medical History:  ?Diagnosis Date  ? HTN (hypertension)   ? Hypothyroidism   ? ? ?Past Surgical History:  ?Procedure Laterality Date  ? CERVICAL DISC SURGERY    ? ? ?Social History ?Social History  ? ?Tobacco Use  ? Smoking status: Never  ? Smokeless tobacco: Never  ?Substance Use Topics  ? Alcohol use: Not Currently  ? Drug use: Never  ? ? ?Family History ?No family history on file. ? ?No Known Allergies ? ? ?REVIEW OF SYSTEMS (Negative unless checked) ? ?Constitutional: [] Weight loss  [] Fever  [] Chills ?Cardiac: [] Chest pain   [] Chest pressure   [] Palpitations   [] Shortness of breath when laying flat   [] Shortness of breath with exertion. ?Vascular:  [] Pain  in legs with walking   [x] Pain in legs at rest  [] History of DVT   [] Phlebitis   [] Swelling in legs   [] Varicose veins   [x] Non-healing ulcers ?Pulmonary:   [] Uses home oxygen   [] Productive cough   [] Hemoptysis   [] Wheeze  [] COPD   [] Asthma ?Neurologic:  [] Dizziness   [] Seizures   [] History of stroke   [] History of TIA  [] Aphasia   [] Vissual changes   [] Weakness or numbness in arm   [x] Weakness or numbness in leg ?Musculoskeletal:   [] Joint swelling   [] Joint pain   [] Low back pain ?Hematologic:  [] Easy bruising  [] Easy  bleeding   [] Hypercoagulable state   [] Anemic ?Gastrointestinal:  [] Diarrhea   [] Vomiting  [] Gastroesophageal reflux/heartburn   [] Difficulty swallowing. ?Genitourinary:  [] Chronic kidney disease   [] Difficult urination  [] Frequent urination   [] Blood in urine ?Skin:  [] Rashes   [] Ulcers  ?Psychological:  [] History of anxiety   []  History of major depression. ? ?Physical Examination ? ?Vitals:  ? 04/06/21 1027  ?BP: 132/81  ?Pulse: 68  ?Resp: 16  ? ?There is no height or weight on file to calculate BMI. ?Gen: WD/WN, seen in a wheelchair mild to moderate distress ?Head: [place]/AT, No temporalis wasting.  ?Ear/Nose/Throat: Hearing grossly intact, nares w/o erythema or drainage ?Eyes: PER, EOMI, sclera nonicteric.  ?Neck: Supple, no masses.  No bruit or JVD.  ?Pulmonary:  Good air movement, no audible wheezing, no use of accessory muscles.  ?Cardiac: RRR, normal S1, S2, no Murmurs. ?Vascular:  full thickness right heel ulcer with necrotic tissue and foul odor.  3+ edema ?Vessel Right Left  ?Radial Palpable Palpable  ?PT Not Palpable Not Palpable  ?DP Not Palpable Not Palpable  ?Gastrointestinal: soft, non-distended. No guarding/no peritoneal signs.  ?Musculoskeletal: M/S 5/5 throughout.  No visible deformity.  ?Neurologic: CN 2-12 intact. Pain and light touch intact in extremities.  Symmetrical.  Speech is fluent. Motor exam as listed above. ?Psychiatric: Judgment intact, Mood & affect appropriate for pt's clinical situation. ?Dermatologic: No rashes +ulcers noted.  No changes consistent with cellulitis. ? ? ?CBC ?Lab Results  ?Component Value Date  ? WBC 2.6 (L) 03/30/2020  ? HGB 10.1 (L) 03/30/2020  ? HCT 28.5 (L) 03/30/2020  ? MCV 106.7 (H) 03/30/2020  ? PLT 68 (L) 03/30/2020  ? ? ?BMET ?   ?Component Value Date/Time  ? NA 135 04/01/2020 0601  ? NA 134 (L) 01/30/2013 0520  ? K 4.7 04/01/2020 0601  ? K 3.6 01/30/2013 0520  ? CL 110 04/01/2020 0601  ? CL 103 01/30/2013 0520  ? CO2 20 (L) 04/01/2020 0601  ? CO2 26  01/30/2013 0520  ? GLUCOSE 98 04/01/2020 0601  ? GLUCOSE 121 (H) 01/30/2013 0520  ? BUN 29 (H) 04/01/2020 0601  ? BUN 19 (H) 01/30/2013 0520  ? CREATININE 1.24 04/01/2020 0601  ? CREATININE 1.22 01/30/2013 0520  ? CALCIUM 8.3 (L) 04/01/2020 0601  ? CALCIUM 8.3 (L) 01/30/2013 0520  ? GFRNONAA 55 (L) 04/01/2020 0601  ? GFRNONAA 54 (L) 01/30/2013 0520  ? GFRAA >60 01/30/2013 0520  ? ?CrCl cannot be calculated (Patient's most recent lab result is older than the maximum 21 days allowed.). ? ?COAG ?No results found for: INR, PROTIME ? ?Radiology ?No results found. ? ? ?Assessment/Plan ?1. PVD (peripheral vascular disease) (HCC) ? Recommend: ? ?The patient has evidence of severe atherosclerotic changes of both lower extremities associated with ulceration and tissue loss of the right foot.  This represents a limb  threatening ischemia and places the patient at the risk for right limb loss. ? ?Patient should undergo angiography of the right lower extremity with the hope for intervention for limb salvage.  The risks and benefits as well as the alternative therapies was discussed in detail with the patient.  All questions were answered.  Patient agrees to proceed with right leg angiography. ? ?The patient will follow up with me in the office after the procedure.   ?- VAS [place] ABI WITH/WO TBI ? ?2. Atherosclerosis of native arteries of the extremities with ulceration (HCC) ?Recommend: ? ?The patient has evidence of severe atherosclerotic changes of both lower extremities associated with ulceration and tissue loss of the right foot.  This represents a limb threatening ischemia and places the patient at the risk for right limb loss. ? ?Patient should undergo angiography of the right lower extremity with the hope for intervention for limb salvage.  The risks and benefits as well as the alternative therapies was discussed in detail with the patient.  All questions were answered.  Patient agrees to proceed with right leg  angiography. ? ?The patient will follow up with me in the office after the procedure.   ? ?3. Primary hypertension ?Continue antihypertensive medications as already ordered, these medications have been reviewed and there a

## 2021-04-06 NOTE — Progress Notes (Signed)
MRN : [id number]  Jeffrey Nielsen is a 88 y.o. (29-Apr-1926) male who presents with chief complaint of right heel ulcer.  History of Present Illness:   The patient is seen for evaluation of painful lower extremities and diminished pulses associated with ulceration of the foot.  The patient notes the ulcer has been present for multiple weeks and has not been improving.  It is very painful and has had some drainage.  No specific history of trauma noted by the patient.  The patient denies fever or chills.  the patient does have diabetes which has been difficult to control.  Patient notes prior to the ulcer developing the extremities were painful particularly with ambulation or activity and the discomfort is very consistent day today. Typically, the pain occurs at less than one block, progress is as activity continues to the point that the patient must stop walking. Resting including standing still for several minutes allowed resumption of the activity and the ability to walk a similar distance before stopping again. Uneven terrain and inclined shorten the distance. The pain has been progressive over the past several years.   The patient denies rest pain or dangling of an extremity off the side of the bed during the night for relief. No prior interventions or surgeries.  No history of back problems or DJD of the lumbar sacral spine.   The patient denies amaurosis fugax or recent TIA symptoms. There are no recent neurological changes noted. The patient denies history of DVT, PE or superficial thrombophlebitis. The patient denies recent episodes of angina or shortness of breath.    ABI's Rt=[place] Rt TBI=1.15 and Lt= and Lt TBI=1.22 biphasic signals throughout.  Current Meds  Medication Sig   ALOE VESTA CLEAR ANTIFUNGAL 2 % OINT Apply 2 g topically 3 (three) times daily.   Amino Acids-Protein Hydrolys (PRO-STAT AWC) LIQD Take by mouth daily.   ascorbic acid (VITAMIN C) 500 MG tablet Take 500 mg by  mouth daily.   divalproex (DEPAKOTE) 125 MG DR tablet Take 125 mg by mouth 2 (two) times daily.   donepezil (ARICEPT) 10 MG tablet Take 10 mg by mouth daily.   fludrocortisone (FLORINEF) 0.1 MG tablet Take 1 tablet (0.1 mg total) by mouth daily.   levothyroxine (SYNTHROID) 112 MCG tablet Take 112 mcg by mouth daily.   lisinopril (ZESTRIL) 20 MG tablet Take 20 mg by mouth daily.   sodium hypochlorite (DAKIN'S 1/2 STRENGTH) external solution Apply 1 application topically daily.   vitamin B-12 (CYANOCOBALAMIN) 1000 MCG tablet Take 1 tablet (1,000 mcg total) by mouth daily.   Wound Dressings (MEDIHONEY WOUND/BURN DRESSING) PSTE Apply 1 application topically daily.   zinc sulfate 220 (50 Zn) MG capsule Take 220 mg by mouth daily.    Past Medical History:  Diagnosis Date   HTN (hypertension)    Hypothyroidism     Past Surgical History:  Procedure Laterality Date   CERVICAL DISC SURGERY      Social History Social History   Tobacco Use   Smoking status: Never   Smokeless tobacco: Never  Substance Use Topics   Alcohol use: Not Currently   Drug use: Never    Family History No family history on file.  No Known Allergies   REVIEW OF SYSTEMS (Negative unless checked)  Constitutional: [] Weight loss  [] Fever  [] Chills Cardiac: [] Chest pain   [] Chest pressure   [] Palpitations   [] Shortness of breath when laying flat   [] Shortness of breath with exertion. Vascular:  [] Pain  in legs with walking   [x] Pain in legs at rest  [] History of DVT   [] Phlebitis   [] Swelling in legs   [] Varicose veins   [x] Non-healing ulcers Pulmonary:   [] Uses home oxygen   [] Productive cough   [] Hemoptysis   [] Wheeze  [] COPD   [] Asthma Neurologic:  [] Dizziness   [] Seizures   [] History of stroke   [] History of TIA  [] Aphasia   [] Vissual changes   [] Weakness or numbness in arm   [x] Weakness or numbness in leg Musculoskeletal:   [] Joint swelling   [] Joint pain   [] Low back pain Hematologic:  [] Easy bruising  [] Easy  bleeding   [] Hypercoagulable state   [] Anemic Gastrointestinal:  [] Diarrhea   [] Vomiting  [] Gastroesophageal reflux/heartburn   [] Difficulty swallowing. Genitourinary:  [] Chronic kidney disease   [] Difficult urination  [] Frequent urination   [] Blood in urine Skin:  [] Rashes   [] Ulcers  Psychological:  [] History of anxiety   []  History of major depression.  Physical Examination  Vitals:   04/06/21 1027  BP: 132/81  Pulse: 68  Resp: 16   There is no height or weight on file to calculate BMI. Gen: WD/WN, seen in a wheelchair mild to moderate distress Head: [place]/AT, No temporalis wasting.  Ear/Nose/Throat: Hearing grossly intact, nares w/o erythema or drainage Eyes: PER, EOMI, sclera nonicteric.  Neck: Supple, no masses.  No bruit or JVD.  Pulmonary:  Good air movement, no audible wheezing, no use of accessory muscles.  Cardiac: RRR, normal S1, S2, no Murmurs. Vascular:  full thickness right heel ulcer with necrotic tissue and foul odor.  3+ edema Vessel Right Left  Radial Palpable Palpable  PT Not Palpable Not Palpable  DP Not Palpable Not Palpable  Gastrointestinal: soft, non-distended. No guarding/no peritoneal signs.  Musculoskeletal: M/S 5/5 throughout.  No visible deformity.  Neurologic: CN 2-12 intact. Pain and light touch intact in extremities.  Symmetrical.  Speech is fluent. Motor exam as listed above. Psychiatric: Judgment intact, Mood & affect appropriate for pt's clinical situation. Dermatologic: No rashes +ulcers noted.  No changes consistent with cellulitis.   CBC Lab Results  Component Value Date   WBC 2.6 (L) 03/30/2020   HGB 10.1 (L) 03/30/2020   HCT 28.5 (L) 03/30/2020   MCV 106.7 (H) 03/30/2020   PLT 68 (L) 03/30/2020    BMET    Component Value Date/Time   NA 135 04/01/2020 0601   NA 134 (L) 01/30/2013 0520   K 4.7 04/01/2020 0601   K 3.6 01/30/2013 0520   CL 110 04/01/2020 0601   CL 103 01/30/2013 0520   CO2 20 (L) 04/01/2020 0601   CO2 26  01/30/2013 0520   GLUCOSE 98 04/01/2020 0601   GLUCOSE 121 (H) 01/30/2013 0520   BUN 29 (H) 04/01/2020 0601   BUN 19 (H) 01/30/2013 0520   CREATININE 1.24 04/01/2020 0601   CREATININE 1.22 01/30/2013 0520   CALCIUM 8.3 (L) 04/01/2020 0601   CALCIUM 8.3 (L) 01/30/2013 0520   GFRNONAA 55 (L) 04/01/2020 0601   GFRNONAA 54 (L) 01/30/2013 0520   GFRAA >60 01/30/2013 0520   CrCl cannot be calculated (Patient's most recent lab result is older than the maximum 21 days allowed.).  COAG No results found for: INR, PROTIME  Radiology No results found.   Assessment/Plan 1. PVD (peripheral vascular disease) (HCC)  Recommend:  The patient has evidence of severe atherosclerotic changes of both lower extremities associated with ulceration and tissue loss of the right foot.  This represents a limb  threatening ischemia and places the patient at the risk for right limb loss.  Patient should undergo angiography of the right lower extremity with the hope for intervention for limb salvage.  The risks and benefits as well as the alternative therapies was discussed in detail with the patient.  All questions were answered.  Patient agrees to proceed with right leg angiography.  The patient will follow up with me in the office after the procedure.   - VAS [place] ABI WITH/WO TBI  2. Atherosclerosis of native arteries of the extremities with ulceration (HCC) Recommend:  The patient has evidence of severe atherosclerotic changes of both lower extremities associated with ulceration and tissue loss of the right foot.  This represents a limb threatening ischemia and places the patient at the risk for right limb loss.  Patient should undergo angiography of the right lower extremity with the hope for intervention for limb salvage.  The risks and benefits as well as the alternative therapies was discussed in detail with the patient.  All questions were answered.  Patient agrees to proceed with right leg  angiography.  The patient will follow up with me in the office after the procedure.    3. Primary hypertension Continue antihypertensive medications as already ordered, these medications have been reviewed and there are no changes at this time.   4. Pressure injury of right heel, stage 4 (HCC) Wound care following  5. 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

## 2021-04-08 ENCOUNTER — Telehealth (INDEPENDENT_AMBULATORY_CARE_PROVIDER_SITE_OTHER): Payer: Self-pay

## 2021-04-08 NOTE — Telephone Encounter (Signed)
Spoke with Cala Bradford at [facility] and rehab and the patient is scheduled with Dr. Gilda Crease for a Right leg angio with possible intervention on 04/21/21 with a 8:00 am arrival time to the MM. Pre-procedure instructions were discussed and will be mailed. ?

## 2021-04-12 ENCOUNTER — Encounter (INDEPENDENT_AMBULATORY_CARE_PROVIDER_SITE_OTHER): Payer: Self-pay | Admitting: Vascular Surgery

## 2021-04-12 DIAGNOSIS — L8990 Pressure ulcer of unspecified site, unspecified stage: Secondary | ICD-10-CM | POA: Insufficient documentation

## 2021-04-20 ENCOUNTER — Telehealth (INDEPENDENT_AMBULATORY_CARE_PROVIDER_SITE_OTHER): Payer: Self-pay

## 2021-04-20 NOTE — Telephone Encounter (Signed)
Patient right le angio has been moved to 2pm arrival time 1pm with Dr Lucky Cowboy. Joelene Millin from [facility] has been aware ?

## 2021-04-21 ENCOUNTER — Telehealth (INDEPENDENT_AMBULATORY_CARE_PROVIDER_SITE_OTHER): Payer: Self-pay | Admitting: Nurse Practitioner

## 2021-04-21 ENCOUNTER — Ambulatory Visit
Admission: RE | Admit: 2021-04-21 | Discharge: 2021-04-21 | Disposition: A | Discharge status: Skilled Nursing Facility | Payer: Medicare Other | Attending: Vascular Surgery | Admitting: Vascular Surgery

## 2021-04-21 ENCOUNTER — Encounter
Admission: RE | Disposition: A | Discharge status: Skilled Nursing Facility | Payer: Self-pay | Source: Home / Self Care | Attending: Vascular Surgery

## 2021-04-21 DIAGNOSIS — I70299 Other atherosclerosis of native arteries of extremities, unspecified extremity: Secondary | ICD-10-CM

## 2021-04-21 DIAGNOSIS — L97909 Non-pressure chronic ulcer of unspecified part of unspecified lower leg with unspecified severity: Secondary | ICD-10-CM

## 2021-04-21 SURGERY — LOWER EXTREMITY ANGIOGRAPHY
Anesthesia: Moderate Sedation | Site: Leg Lower | Laterality: Right

## 2021-04-21 MED ORDER — SODIUM CHLORIDE 0.9 % IV SOLN: INTRAVENOUS | Status: DC

## 2021-04-21 MED ORDER — METHYLPREDNISOLONE SODIUM SUCC 125 MG IJ SOLR: 125.0000 mg | Freq: Once | INTRAMUSCULAR | Status: DC | PRN

## 2021-04-21 MED ORDER — ONDANSETRON HCL 4 MG/2ML IJ SOLN: 4.0000 mg | Freq: Four times a day (QID) | INTRAMUSCULAR | Status: DC | PRN

## 2021-04-21 MED ORDER — DIPHENHYDRAMINE HCL 50 MG/ML IJ SOLN: 50.0000 mg | Freq: Once | INTRAMUSCULAR | Status: DC | PRN

## 2021-04-21 MED ORDER — CEFAZOLIN SODIUM-DEXTROSE 2-4 GM/100ML-% IV SOLN: 2.0000 g | Freq: Once | INTRAVENOUS | Status: DC

## 2021-04-21 MED ORDER — MIDAZOLAM HCL 2 MG/ML PO SYRP: 8.0000 mg | ORAL_SOLUTION | Freq: Once | ORAL | Status: DC | PRN

## 2021-04-21 MED ORDER — HYDROMORPHONE HCL 1 MG/ML IJ SOLN: 1.0000 mg | Freq: Once | INTRAMUSCULAR | Status: DC | PRN

## 2021-04-21 MED ORDER — FAMOTIDINE 20 MG PO TABS: 40.0000 mg | ORAL_TABLET | Freq: Once | ORAL | Status: DC | PRN

## 2021-04-21 NOTE — Interval H&P Note (Signed)
History and Physical Interval Note: ? ?04/21/2021 ?1:31 PM ? ?Jeffrey Nielsen  has presented today for surgery, with the diagnosis of RLE Angiography with poss Intervention   ASO with ulceration   BARD Rep.  The various methods of treatment have been discussed with the patient and family. After consideration of risks, benefits and other options for treatment, the patient has consented to  Procedure(s): ?Lower Extremity Angiography (Right) as a surgical intervention.  The patient's history has been reviewed, patient examined, no change in status, stable for surgery.  I have reviewed the patient's chart and labs.  Questions were answered to the patient's satisfaction.   ? ? ?[name] ? ? ?

## 2021-04-21 NOTE — Telephone Encounter (Signed)
Jeffrey Nielsen will contact Cala Bradford from [facility] to reschedule patient procedure ?

## 2021-04-21 NOTE — Telephone Encounter (Signed)
Cala Bradford called and wanted to have patient's angio rescheduled.  Please call [phone]. ?

## 2021-04-21 NOTE — OR Nursing (Signed)
Pt ate lunch. [facility] called to verify. Need to reschedule per dr dew. Son in Social worker notified. Office notified. Patient notified ?

## 2021-04-22 NOTE — Telephone Encounter (Signed)
I attempted to contact Cala Bradford at [facility] to reschedule the patient's procedure and a message was left for a return call. ?

## 2021-04-22 NOTE — Telephone Encounter (Signed)
Jeffrey Nielsen called to r/s pt's angio. Please call [facility] @ [phone] ?

## 2021-04-23 NOTE — Telephone Encounter (Signed)
Spoke with Joelene Millin at [facility]. The patient has been rescheduled with Dr. Lucky Cowboy to 05/04/21 with a 6:45 am arrival time to the MM for a right leg angio with possible intervention. Pre-procedure instructions were discussed and will be faxed to [facility] at [facility]. ?

## 2021-05-01 ENCOUNTER — Emergency Department: Payer: Medicare Other

## 2021-05-01 ENCOUNTER — Inpatient Hospital Stay
Admission: EM | Admit: 2021-05-01 | Discharge: 2021-05-05 | DRG: 871 | Disposition: A | Discharge status: Hospice | Payer: Medicare Other | Attending: Internal Medicine | Admitting: Internal Medicine

## 2021-05-01 DIAGNOSIS — I70299 Other atherosclerosis of native arteries of extremities, unspecified extremity: Secondary | ICD-10-CM | POA: Diagnosis not present

## 2021-05-01 DIAGNOSIS — J189 Pneumonia, unspecified organism: Secondary | ICD-10-CM | POA: Diagnosis present

## 2021-05-01 DIAGNOSIS — F0280 Dementia in other diseases classified elsewhere without behavioral disturbance: Secondary | ICD-10-CM | POA: Diagnosis present

## 2021-05-01 DIAGNOSIS — Z515 Encounter for palliative care: Secondary | ICD-10-CM

## 2021-05-01 DIAGNOSIS — L97909 Non-pressure chronic ulcer of unspecified part of unspecified lower leg with unspecified severity: Secondary | ICD-10-CM

## 2021-05-01 DIAGNOSIS — D696 Thrombocytopenia, unspecified: Secondary | ICD-10-CM

## 2021-05-01 DIAGNOSIS — L97429 Non-pressure chronic ulcer of left heel and midfoot with unspecified severity: Secondary | ICD-10-CM | POA: Diagnosis present

## 2021-05-01 DIAGNOSIS — Z6828 Body mass index (BMI) 28.0-28.9, adult: Secondary | ICD-10-CM | POA: Diagnosis not present

## 2021-05-01 DIAGNOSIS — Z66 Do not resuscitate: Secondary | ICD-10-CM | POA: Diagnosis present

## 2021-05-01 DIAGNOSIS — R4182 Altered mental status, unspecified: Secondary | ICD-10-CM

## 2021-05-01 DIAGNOSIS — N179 Acute kidney failure, unspecified: Secondary | ICD-10-CM | POA: Diagnosis not present

## 2021-05-01 DIAGNOSIS — R55 Syncope and collapse: Secondary | ICD-10-CM | POA: Diagnosis present

## 2021-05-01 DIAGNOSIS — I70239 Atherosclerosis of native arteries of right leg with ulceration of unspecified site: Secondary | ICD-10-CM | POA: Diagnosis present

## 2021-05-01 DIAGNOSIS — G309 Alzheimer's disease, unspecified: Secondary | ICD-10-CM | POA: Diagnosis present

## 2021-05-01 DIAGNOSIS — I10 Essential (primary) hypertension: Secondary | ICD-10-CM | POA: Diagnosis present

## 2021-05-01 DIAGNOSIS — Z79899 Other long term (current) drug therapy: Secondary | ICD-10-CM

## 2021-05-01 DIAGNOSIS — E039 Hypothyroidism, unspecified: Secondary | ICD-10-CM | POA: Diagnosis present

## 2021-05-01 DIAGNOSIS — G929 Unspecified toxic encephalopathy: Secondary | ICD-10-CM | POA: Diagnosis present

## 2021-05-01 DIAGNOSIS — A419 Sepsis, unspecified organism: Secondary | ICD-10-CM | POA: Diagnosis present

## 2021-05-01 DIAGNOSIS — D61818 Other pancytopenia: Secondary | ICD-10-CM | POA: Diagnosis present

## 2021-05-01 DIAGNOSIS — J9601 Acute respiratory failure with hypoxia: Secondary | ICD-10-CM

## 2021-05-01 DIAGNOSIS — E872 Acidosis, unspecified: Secondary | ICD-10-CM

## 2021-05-01 DIAGNOSIS — R627 Adult failure to thrive: Secondary | ICD-10-CM | POA: Diagnosis present

## 2021-05-01 DIAGNOSIS — R64 Cachexia: Secondary | ICD-10-CM | POA: Diagnosis present

## 2021-05-01 DIAGNOSIS — N170 Acute kidney failure with tubular necrosis: Secondary | ICD-10-CM | POA: Diagnosis present

## 2021-05-01 DIAGNOSIS — Z20822 Contact with and (suspected) exposure to covid-19: Secondary | ICD-10-CM | POA: Diagnosis present

## 2021-05-01 DIAGNOSIS — I70244 Atherosclerosis of native arteries of left leg with ulceration of heel and midfoot: Secondary | ICD-10-CM | POA: Diagnosis present

## 2021-05-01 DIAGNOSIS — R680 Hypothermia, not associated with low environmental temperature: Secondary | ICD-10-CM | POA: Diagnosis present

## 2021-05-01 DIAGNOSIS — L97419 Non-pressure chronic ulcer of right heel and midfoot with unspecified severity: Secondary | ICD-10-CM | POA: Diagnosis present

## 2021-05-01 DIAGNOSIS — Z79890 Hormone replacement therapy (postmenopausal): Secondary | ICD-10-CM

## 2021-05-01 DIAGNOSIS — R52 Pain, unspecified: Secondary | ICD-10-CM | POA: Diagnosis not present

## 2021-05-01 DIAGNOSIS — R6521 Severe sepsis with septic shock: Secondary | ICD-10-CM | POA: Diagnosis present

## 2021-05-01 LAB — LACTIC ACID, PLASMA
Lactic Acid, Venous: 4.4 mmol/L (ref 0.5–1.9)
Lactic Acid, Venous: 4.6 mmol/L (ref 0.5–1.9)

## 2021-05-01 LAB — CBC
HCT: 26.4 % — ABNORMAL LOW (ref 39.0–52.0)
Hemoglobin: 8.6 g/dL — ABNORMAL LOW (ref 13.0–17.0)
MCH: 35.5 pg — ABNORMAL HIGH (ref 26.0–34.0)
MCHC: 32.6 g/dL (ref 30.0–36.0)
MCV: 109.1 fL — ABNORMAL HIGH (ref 80.0–100.0)
Platelets: 17 10*3/uL — CL (ref 150–400)
RBC: 2.42 MIL/uL — ABNORMAL LOW (ref 4.22–5.81)
RDW: 17.9 % — ABNORMAL HIGH (ref 11.5–15.5)
WBC: 6.8 10*3/uL (ref 4.0–10.5)
nRBC: 0.3 % — ABNORMAL HIGH (ref 0.0–0.2)

## 2021-05-01 LAB — GLUCOSE, CAPILLARY
Glucose-Capillary: 81 mg/dL (ref 70–99)
Glucose-Capillary: 74 mg/dL (ref 70–99)

## 2021-05-01 LAB — RESP PANEL BY RT-PCR (FLU A&B, COVID) ARPGX2
Influenza A by PCR: NEGATIVE
Influenza B by PCR: NEGATIVE
SARS Coronavirus 2 by RT PCR: NEGATIVE

## 2021-05-01 LAB — COMPREHENSIVE METABOLIC PANEL
ALT: 38 U/L (ref 0–44)
AST: 41 U/L (ref 15–41)
Albumin: 2.1 g/dL — ABNORMAL LOW (ref 3.5–5.0)
Alkaline Phosphatase: 236 U/L — ABNORMAL HIGH (ref 38–126)
Anion gap: 13 (ref 5–15)
BUN: 89 mg/dL — ABNORMAL HIGH (ref 8–23)
CO2: 19 mmol/L — ABNORMAL LOW (ref 22–32)
Calcium: 7.7 mg/dL — ABNORMAL LOW (ref 8.9–10.3)
Chloride: 108 mmol/L (ref 98–111)
Creatinine, Ser: 2.84 mg/dL — ABNORMAL HIGH (ref 0.61–1.24)
GFR, Estimated: 20 mL/min — ABNORMAL LOW (ref >60)
Glucose, Bld: 88 mg/dL (ref 70–99)
Potassium: 4.4 mmol/L (ref 3.5–5.1)
Sodium: 140 mmol/L (ref 135–145)
Total Bilirubin: 1.2 mg/dL (ref 0.3–1.2)
Total Protein: 6.2 g/dL — ABNORMAL LOW (ref 6.5–8.1)

## 2021-05-01 LAB — BLOOD GAS, VENOUS
Acid-base deficit: 5.4 mmol/L — ABNORMAL HIGH (ref 0.0–2.0)
Bicarbonate: 20 mmol/L (ref 20.0–28.0)
O2 Saturation: 69.7 %
Patient temperature: 37
pCO2, Ven: 38 mmHg — ABNORMAL LOW (ref 44–60)
pH, Ven: 7.33 (ref 7.25–7.43)
pO2, Ven: 45 mmHg (ref 32–45)

## 2021-05-01 LAB — CBG MONITORING, ED
Glucose-Capillary: 81 mg/dL (ref 70–99)
Glucose-Capillary: 87 mg/dL (ref 70–99)

## 2021-05-01 LAB — D-DIMER, QUANTITATIVE: D-Dimer, Quant: 1.45 ug/mL-FEU — ABNORMAL HIGH (ref 0.00–0.50)

## 2021-05-01 LAB — FIBRINOGEN: Fibrinogen: 570 mg/dL — ABNORMAL HIGH (ref 210–475)

## 2021-05-01 LAB — URINALYSIS, COMPLETE (UACMP) WITH MICROSCOPIC
Bilirubin Urine: NEGATIVE
Glucose, UA: NEGATIVE mg/dL
Ketones, ur: NEGATIVE mg/dL
Nitrite: NEGATIVE
Protein, ur: 30 mg/dL — AB
RBC / HPF: >50 RBC/hpf — ABNORMAL HIGH (ref 0–5)
Specific Gravity, Urine: 1.02 (ref 1.005–1.030)
WBC, UA: >50 WBC/hpf — ABNORMAL HIGH (ref 0–5)
pH: 5 (ref 5.0–8.0)

## 2021-05-01 LAB — PROTIME-INR
INR: 1.7 — ABNORMAL HIGH (ref 0.8–1.2)
Prothrombin Time: 19.9 seconds — ABNORMAL HIGH (ref 11.4–15.2)

## 2021-05-01 LAB — PROCALCITONIN: Procalcitonin: 1.41 ng/mL

## 2021-05-01 LAB — BRAIN NATRIURETIC PEPTIDE: B Natriuretic Peptide: 329.1 pg/mL — ABNORMAL HIGH (ref 0.0–100.0)

## 2021-05-01 LAB — T4, FREE: Free T4: 1.29 ng/dL — ABNORMAL HIGH (ref 0.61–1.12)

## 2021-05-01 LAB — TSH: TSH: 2.255 u[IU]/mL (ref 0.350–4.500)

## 2021-05-01 LAB — APTT: aPTT: 61 seconds — ABNORMAL HIGH (ref 24–36)

## 2021-05-01 MED ORDER — POLYETHYLENE GLYCOL 3350 17 G PO PACK: 17.0000 g | PACK | Freq: Every day | ORAL | Status: DC | PRN

## 2021-05-01 MED ORDER — SODIUM CHLORIDE 0.9 % IV SOLN
2.0000 g | INTRAVENOUS | Status: DC
  Administered 2021-05-02 – 2021-05-03 (×2): 2 g via INTRAVENOUS
  Filled 2021-05-01: qty 2
  Filled 2021-05-01 (×2): qty 12.5

## 2021-05-01 MED ORDER — VANCOMYCIN HCL 1500 MG/300ML IV SOLN
1500.0000 mg | Freq: Once | INTRAVENOUS | Status: AC
  Administered 2021-05-01: 1500 mg via INTRAVENOUS
  Filled 2021-05-01: qty 300

## 2021-05-01 MED ORDER — ONDANSETRON HCL 4 MG/2ML IJ SOLN: 4.0000 mg | Freq: Four times a day (QID) | INTRAMUSCULAR | Status: DC | PRN

## 2021-05-01 MED ORDER — SODIUM CHLORIDE 0.9 % IV SOLN
2.0000 g | Freq: Once | INTRAVENOUS | Status: AC
  Administered 2021-05-01: 2 g via INTRAVENOUS
  Filled 2021-05-01: qty 12.5

## 2021-05-01 MED ORDER — MORPHINE SULFATE (PF) 2 MG/ML IV SOLN
2.0000 mg | INTRAVENOUS | Status: AC
  Administered 2021-05-01: 2 mg via INTRAVENOUS
  Filled 2021-05-01: qty 1

## 2021-05-01 MED ORDER — DOCUSATE SODIUM 100 MG PO CAPS: 100.0000 mg | ORAL_CAPSULE | Freq: Two times a day (BID) | ORAL | Status: DC | PRN

## 2021-05-01 MED ORDER — SODIUM CHLORIDE 0.9 % IV SOLN
250.0000 mL | INTRAVENOUS | Status: DC
Start: 2021-05-01 — End: 2021-05-04
  Administered 2021-05-01: 250 mL via INTRAVENOUS

## 2021-05-01 MED ORDER — IPRATROPIUM-ALBUTEROL 0.5-2.5 (3) MG/3ML IN SOLN: 3.0000 mL | RESPIRATORY_TRACT | Status: DC | PRN

## 2021-05-01 MED ORDER — LACTATED RINGERS IV BOLUS (SEPSIS)
1000.0000 mL | Freq: Once | INTRAVENOUS | Status: AC
  Administered 2021-05-01: 1000 mL via INTRAVENOUS

## 2021-05-01 MED ORDER — DEXTROSE 10 % IV SOLN: INTRAVENOUS | Status: AC

## 2021-05-01 MED ORDER — VANCOMYCIN VARIABLE DOSE PER UNSTABLE RENAL FUNCTION (PHARMACIST DOSING)
Status: DC
Start: 2021-05-01 — End: 2021-05-04

## 2021-05-01 MED ORDER — VANCOMYCIN HCL IN DEXTROSE 1-5 GM/200ML-% IV SOLN
1000.0000 mg | Freq: Once | INTRAVENOUS | Status: AC
  Administered 2021-05-01: 1000 mg via INTRAVENOUS
  Filled 2021-05-01: qty 200

## 2021-05-01 MED ORDER — NOREPINEPHRINE 4 MG/250ML-% IV SOLN
2.0000 ug/min | INTRAVENOUS | Status: DC
  Administered 2021-05-01: 10 ug/min via INTRAVENOUS
  Administered 2021-05-02: 7 ug/min via INTRAVENOUS
  Administered 2021-05-02 (×2): 8 ug/min via INTRAVENOUS
  Administered 2021-05-03: 3 ug/min via INTRAVENOUS
  Filled 2021-05-01 (×4): qty 250

## 2021-05-01 MED ORDER — NOREPINEPHRINE 4 MG/250ML-% IV SOLN
0.0000 ug/min | INTRAVENOUS | Status: DC
  Administered 2021-05-01: 2 ug/min via INTRAVENOUS
  Filled 2021-05-01: qty 250

## 2021-05-01 MED ORDER — LACTATED RINGERS IV BOLUS (SEPSIS)
500.0000 mL | Freq: Once | INTRAVENOUS | Status: AC
  Administered 2021-05-01: 500 mL via INTRAVENOUS

## 2021-05-01 NOTE — ED Notes (Signed)
Critical lactic and platelet count notified to provider ?

## 2021-05-01 NOTE — Progress Notes (Signed)
CODE SEPSIS - PHARMACY COMMUNICATION ? ?**Broad Spectrum Antibiotics should be administered within 1 hour of Sepsis diagnosis** ? ?Time Code Sepsis Called/Page Received: [id number] ? ?Antibiotics Ordered: cefepime and vanc ? ?Time of 1st antibiotic administration: 1545 ?  ?Additional action taken by pharmacy:   ? ?If necessary, Name of Provider/Nurse Contacted:   ? ? ? ?Noralee Space ,PharmD ?Clinical Pharmacist  ?05/01/2021  4:33 PM  ?

## 2021-05-01 NOTE — ED Notes (Signed)
Pt placed on bear hugger at this time.

## 2021-05-01 NOTE — Consult Note (Signed)
Pharmacy Antibiotic Note ? ?Jeffrey Nielsen is a 88 y.o. male w/ h/o severe PAD w/ bilateral wounds/foot ulcerations, alzheimer's dementia, chronic anemia & worsened chronic thrombocytopenia admitted on 05/01/2021 for septic shock 2/2 HCAP.  Pharmacy has been consulted for Vancomycin & cefepime dosing. ? ?Plan: ?Cefepime 2g IV x1 in ED (4/07 1600); followed by cefepime 2g IV q24h ?Vancomycin 1g x1 in ED (4/07 1630) will give an additional 1.5g x1 now (4/07 2200) to complete loading dose of 2.5g. (Current renal fxn gives dosing interval of 36-48hrs ISO AKI. Will hold off scheduling doses to see if renal fxn recovers or if dosing by levels is more appropriate.) ? ?Height: 6\' 4"  (193 cm) ?Weight: 108 kg (238 lb 1.6 oz) ?IBW/kg (Calculated) : 86.8 ? ?Temp (24hrs), Avg:90.8 ?F (32.7 ?C), Min:88.2 ?F (31.2 ?C), Max:93.4 ?F (34.1 ?C) ? ?Recent Labs  ?Lab 05/01/21 ?1511 05/01/21 ?1532  ?WBC 6.8  --   ?CREATININE 2.84*  --   ?LATICACIDVEN  --  4.4*  ?  ?Estimated Creatinine Clearance: 22.4 mL/min (A) (by C-G formula based on SCr of 2.84 mg/dL (H)).   ? ?No Known Allergies ? ?Antimicrobials this admission: ?Vancomycin & cefepime (4/07 >>  ? ?Dose adjustments this admission: ?CTM closely ISO AKI. Scr 2.84 (baseline of 1.24 in 03/2020) ? ?Microbiology results: ?4/07 BCx: pending ?4/07 UCx: pending  ?4/07 Flu/Cov: negative  ?4/07 MRSA PCR: ordered ? ?Thank you for allowing pharmacy to be a part of this patient?s care. ? ?Shanon Brow Yatziri Wainwright ?05/01/2021 8:53 PM ? ?

## 2021-05-01 NOTE — ED Notes (Signed)
ICU practitioner at pt bedside. ?

## 2021-05-01 NOTE — Sepsis Progress Note (Signed)
Notified bedside nurse of need to order repeat lactic acid.  

## 2021-05-01 NOTE — Sepsis Progress Note (Signed)
Notified bedside nurse of need to draw repeat lactic acid. 

## 2021-05-01 NOTE — ED Notes (Addendum)
RN un bandaged bilat heels on pt. On the rt foot pt has an unstageable wound due to eschar on the anterior side of the heel. Pt wound is mal odorous with drainage. Pt also has a second stage wound on the lateral side of the left foot. Per nursing home, pt is suppose to have a angiogram done on Monday, 05/04/2021 for veinous flow on both feet. ?

## 2021-05-01 NOTE — Progress Notes (Signed)
An USGPIV (ultrasound guided PIV) has been placed for short-term vasopressor infusion. A correctly placed ivWatch must be used when administering Vasopressors. Should this treatment be needed beyond 72 hours, central line access should be obtained.  It will be the responsibility of the bedside nurse to follow best practice to prevent extravasations.   ?

## 2021-05-01 NOTE — ED Notes (Signed)
RN notify provider about pt vs. ? ?

## 2021-05-01 NOTE — Progress Notes (Signed)
MICU Attestation ? ?Patient seen and examined and relevant ancillary tests reviewed.  I agree with the assessment and plan of care as outlined by Zada Girt, NP. This patient was not seen as a shared visit. The following reflects my independent critical care time for 05/01/21. ? ?[name] is admitted for septic shock 2/2 CAP. He has apparently failed outpatient therapy for pneumonia. He arrived hypothermic and profoundly hypotensive with altered sensorium on a background of Alzheimer's dementia. He has severe PAD with bilateral lower extremity wounds; he follows with Vascular (Dr. Gilda Crease) in the outpatient setting. He is accompanied by his step son and Education officer, environmental. He resides in [facility]. He is DNR/DNI. ? ?Blood pressure 96/84, pulse 61, temperature (!) 91.2 ?F (32.9 ?C), resp. rate (!) 26, weight 67.8 kg, SpO2 96 %. ? ? ?Intake/Output Summary (Last 24 hours) at 05/01/2021 1839 ?Last data filed at 05/01/2021 1733 ?Gross per 24 hour  ?Intake 200 ml  ?Output --  ?Net 200 ml  ? ? ?  ? ?On exam he is an ill appearing, thin elderly male. Simple face mask at 10 L/min. Lungs show diffuse bilateral crackles and rhonchi. Cardiac exam unremarkable. Abdomen is non-tender to palpation with normal bowel sounds. Lower extremities have significant wounds bilaterally. ? ?Impression/Plan: ?Septic shock 2/2 multifocal CAP with MDRO risk factors ?Acute hypoxic respiratory failure ?Hypothermia ?Lactic acidosis ?AKI ?Acute worsening of chronic thrombocytopenia ?Chronic anemia ?Severe PAD with ischemic bilateral lower extremity wounds ? ?- Discussed GOC with stepson. Confirmed DNR/DNI. Doreene Adas would like Mr. Corkery to be treated for infection and is okay with pressor support, but does not want to provide interventions that will be directly uncomfortable. ?- MAP goal >65; continue peripheral pressors while warming ?- If need for CVC arises, will need detailed discussion with stepson regarding risk/benefit ?- Continue broad empiric  antibiotics ?- F/u culture data; collect sputum cx if able ?- Transition to HHFNC, SpO2 goal >/=90% ?- Trend lactate to close; continue volume resuscitation ?- Trend renal function, UOP; avoid nephrotoxins, renally dose medications ?- Send peripheral smear for schistocytes, DIC panel; if schistocytes present, will send [id number] ?- Monitor for signs/sxs of bleeding ?- Will need Vascular Surgery consult; doubt he is a candidate for angiogram currently ?- Redress wounds, wet to dry dressings BID ? ?This patient is critically ill with multiple organ system failure; which, requires frequent high complexity decision making, assessment, support, evaluation, and titration of therapies. This was completed through the application of advanced monitoring technologies and extensive interpretation of multiple databases. During this encounter critical care time was devoted to patient care services described in this note for 50 minutes. ? ?Marcelo Baldy, MD 05/01/21 6:39 PM    ?

## 2021-05-01 NOTE — Progress Notes (Signed)
PHARMACY -  BRIEF ANTIBIOTIC NOTE  ? ?Pharmacy has received consult(s) for cefepime and vancomycin from an ED provider.  The patient's profile has been reviewed for ht/wt/allergies/indication/available labs.   ? ?One time order(s) placed by MD for cefepime and Vancomycin ? ?Further antibiotics/pharmacy consults should be ordered by admitting physician if indicated.       ?                ?Thank you, ?Early Ord A ?05/01/2021  3:39 PM  ?

## 2021-05-01 NOTE — H&P (Signed)
? ?NAME:  Jeffrey Nielsen, MRN:  [id number], DOB:  1926-05-20, LOS: 0 ?ADMISSION DATE:  05/01/2021, CONSULTATION DATE: 05/01/2021 ?REFERRING MD: Dr. Cherylann Banas, CHIEF COMPLAINT: Pneumonia  ? ?History of Present Illness:  ?This is a 88 yo male who presented to [facility] ER via EMS from [facility] with confirmed pneumonia dx via CXR a few days ago.  Rocephin x2 doses initiated at facility, however symptoms worsened with O2 sats in the low 80's on RA prompting ER visit.  HPI obtained from chart pt unable to participate due to severe respiratory distress along with acute encephalopathy. ? ?ED Course ?Upon arrival to the ER pt significantly hypothermic temp 89.3 degrees F rectally, bradycardic hr 49-50's, and hypotensive map 54-60's.  Pt also hypoxic requiring 10L/min via NRB.  COVID-19/Influenza A&B by PCR negative, however CXR revealed widespread pneumonia along with possible pulmonary edema.  Lab results revealed CO2 19, BUN 89, creatinine 2.84, alk phos 236, albumin 2.1, lactic acid 4.4, hgb 8.6, platelets 17, and PT 19.9/INR 1.7.  UA with small leukocytes/rare bacteria/>50 rbc/>50 wbc.  Sepsis protocol initiated pt received 2L LR bolus, cefepime, and vancomycin.  Due to continued hypotension levophed gtt initiated.  PCCM team contacted for ICU admission.  ? 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 ?Significant Hospital Events: ?Including procedures, antibiotic start and stop dates in addition to other pertinent events   ?04/7: Pt admitted to ICU with acute kidney injury, severe sepsis, and acute hypoxic respiratory failure secondary to widespread pneumonia requiring levophed gtt  ? ?Interim History / Subjective:  ?Pt in severe respiratory distress on 10L/min via NRB and levophed gtt _0 /min.  Bear hugger in place due to hypothermia.   ? ?Objective   ?Blood pressure 96/84, pulse  61, temperature (!) 91.2 ?F (32.9 ?C), resp. rate (!) 26, weight 67.8 kg, SpO2 96 %. ?   ?   ? ?Intake/Output Summary (Last 24 hours) at 05/01/2021 1824 ?Last data filed at 05/01/2021 1733 ?Gross per 24 hour  ?Intake 200 ml  ?Output --  ?Net 200 ml  ? ?Filed Weights  ? 05/01/21 1505  ?Weight: 67.8 kg  ? ? ?Examination: ?General: Acutely ill appearing male in severe respiratory distress on 10L/min via NRB ?HENT: Supple, mild JVD present  ?Lungs: Diffuse rhonchi along with crackles, tachypneic and mildly labored respirations  ?Cardiovascular: Sinus bradycardia, s1s2, no r/g, 1+ radial/1+ distal pulses, 3+bilateral lower extremity pitting edema, 2+ bilateral upper extremity edema  ?Abdomen: +BS x4, obese, soft, non tender, non distended  ?Extremities: right heel ulceration along with bilateral lower extremity vascular changes.  See media for imaging  ?Neuro: Confused, attempting to follow commands, PERRLA  ?GU: Indwelling foley catheter draining tea colored urine  ? ?Resolved Hospital Problem list   ? ?Assessment & Plan:  ?Acute hypoxic respiratory failure secondary to multifocal pneumonia  ?- Supplemental O2 for dyspnea and/or hypoxia  ?- Prn bronchodilator therapy  ?- Trend WBC and monitor fever curve  ?- Trend PCT  ?- Follow cultures ?- Continue vancomycin and cefepime for now deescalate pending cultures ? ?Severe sepsis with septic shock  ?- Continuous telemetry monitoring  ?- Peripheral levophed gtt to maintain map >65 ? ?Acute kidney injury secondary to ATN in setting sepsis  ?Lactic acidosis  ?- Trend BMP and lactic acid ?- Replace electrolytes as indicated  ?- Monitor UOP ?- Avoid nephrotoxic medications ? ?Acute on chronic thrombocytopenia in setting  sepsis  ?- Trend CBC ?- DIC panel and peripheral smear pending  ?- Monitor for s/sx of bleeding and transfuse platelets and/or pRBC's for active signs of bleeding ? ?Hypothermia ?- Prn bearhugger to maintain normothermia  ? ?Atherosclerosis of bilateral lower  extremities with right heel ulceration~pt previously scheduled for RLE angiography  ?- Will consult vascular surgery appreciate input  ? ?Acute toxic encephalopathy  ?- Supportive care  ?- Frequent reorientation  ? ?Best Practice (right click and "Reselect all SmartList Selections" daily)  ? ?Diet/type: NPO ?DVT prophylaxis: SCD ?GI prophylaxis: N/A ?Lines: N/A ?Foley:  Yes, and it is still needed ?Code Status:  DNR ?Last date of multidisciplinary goals of care discussion [N/A] ? ?Discussed plan of care with pts son [name] at bedside he confirmed pt is DNR/DNI.  He states he would like to continue current plan of care as outlined above, however he does want to keep the pt comfortable.  Discussed if pt declines and does not respond to treatment will need to consider transitioning to Comfort Measures Only. ?Labs   ?CBC: ?Recent Labs  ?Lab 05/01/21 ?1511  ?WBC 6.8  ?HGB 8.6*  ?HCT 26.4*  ?MCV 109.1*  ?PLT 17*  ? ? ?Basic Metabolic Panel: ?Recent Labs  ?Lab 05/01/21 ?1511  ?NA 140  ?K 4.4  ?CL 108  ?CO2 19*  ?GLUCOSE 88  ?BUN 89*  ?CREATININE 2.84*  ?CALCIUM 7.7*  ? ?GFR: ?CrCl cannot be calculated (Unknown ideal weight.). ?Recent Labs  ?Lab 05/01/21 ?1511 05/01/21 ?1532  ?WBC 6.8  --   ?LATICACIDVEN  --  4.4*  ? ? ?Liver Function Tests: ?Recent Labs  ?Lab 05/01/21 ?1511  ?AST 41  ?ALT 38  ?ALKPHOS 236*  ?BILITOT 1.2  ?PROT 6.2*  ?ALBUMIN 2.1*  ? ?No results for input(s): LIPASE, AMYLASE in the last 168 hours. ?No results for input(s): AMMONIA in the last 168 hours. ? ?ABG ?   ?Component Value Date/Time  ? HCO3 20.0 05/01/2021 1540  ? ACIDBASEDEF 5.4 (H) 05/01/2021 1540  ? O2SAT 69.7 05/01/2021 1540  ?  ? ?Coagulation Profile: ?Recent Labs  ?Lab 05/01/21 ?1511  ?INR 1.7*  ? ? ?Cardiac Enzymes: ?No results for input(s): CKTOTAL, CKMB, CKMBINDEX, TROPONINI in the last 168 hours. ? ?HbA1C: ?No results found for: HGBA1C ? ?CBG: ?Recent Labs  ?Lab 05/01/21 ?1533  ?GLUCAP 81  ? ? ?Review of Systems:   ?Unable to  assess due to acute encephalopathy  ? ?Past Medical History:  ?He,  has a past medical history of HTN (hypertension) and Hypothyroidism.  ? ?Surgical History:  ? ?Past Surgical History:  ?Procedure Laterality Date  ? CERVICAL DISC SURGERY    ?  ? ?Social History:  ? reports that he has never smoked. He has never used smokeless tobacco. He reports that he does not currently use alcohol. He reports that he does not use drugs.  ? ?Family History:  ?His family history is not on file.  ? ?Allergies ?No Known Allergies  ? ?Home Medications  ?Prior to Admission medications   ?Medication Sig Start Date End Date Taking? Authorizing Provider  ?ALOE VESTA CLEAR ANTIFUNGAL 2 % OINT Apply 1 application. topically 3 (three) times daily as needed (nail fungus).   Yes [provider]  ?Amino Acids-Protein Hydrolys (PRO-STAT AWC) LIQD Take 30 mLs by mouth daily.   Yes [provider]  ?cefTRIAXone (ROCEPHIN) IVPB Inject 1 g into the vein daily. 04/30/21 05/06/21 Yes [provider]  ?divalproex (DEPAKOTE) 125 MG DR  tablet Take 125 mg by mouth 2 (two) times daily. 03/18/20  Yes [provider]  ?donepezil (ARICEPT) 10 MG tablet Take 10 mg by mouth at bedtime. 03/18/20  Yes [provider]  ?fludrocortisone (FLORINEF) 0.1 MG tablet Take 1 tablet (0.1 mg total) by mouth daily. 04/01/20  Yes Sharen Hones, MD  ?guaifenesin (HUMIBID E) 400 MG TABS tablet Take 400 mg by mouth 3 (three) times daily. 04/29/21 05/06/21 Yes [provider]  ?ipratropium-albuterol (DUONEB) 0.5-2.5 (3) MG/3ML SOLN Take 3 mLs by nebulization every 6 (six) hours. 04/30/21 05/10/21 Yes [provider]  ?levothyroxine (SYNTHROID) 112 MCG tablet Take 112 mcg by mouth daily. 03/10/20  Yes [provider]  ?lisinopril (ZESTRIL) 20 MG tablet Take 20 mg by mouth daily. Hold for pulse less that 60 and notify provider  ?Hold for SBP 100 or less and notify provider 02/29/20  Yes [provider]  ?Skin  Protectants, Misc. (EUCERIN) cream Apply 1 application. topically daily.   Yes [provider]  ?Sodium Hypochlorite 0.5 % SOLN Apply 1 application. topically 2 (two) times daily.   Yes [provider]

## 2021-05-01 NOTE — ED Notes (Signed)
Pt now attempting to grab at equipment and IV lines as well as Oxygen mask. RN placed [employer] on pt. ?

## 2021-05-01 NOTE — ED Notes (Signed)
RN re dressed pt wounds on his rt heel and lt ankle with telfa and roll guaze. ?

## 2021-05-01 NOTE — ED Provider Notes (Signed)
? ?[facility] ?Provider Note ? ? ? Event Date/Time  ? First MD Initiated Contact with Patient 05/01/21 1516   ?  (approximate) ? ? ?History  ? ?Altered Mental Status ? ? ?HPI ? ?Jeffrey Nielsen is a 88 y.o. male with a history of hypertension, hypothyroidism, and dementia who presents with altered mental status and hypoxia.  Per EMS, the patient was diagnosed in the last few days with pneumonia on x-ray and was receiving Rocephin with no improvement.  Today he was noted to have an O2 saturation in the low 80s on room air and was sent to the ED.  The patient himself is unable to give any history. ? ? ?Physical Exam  ? ?Triage Vital Signs: ?ED Triage Vitals  ?Enc Vitals Group  ?   BP 05/01/21 1508 (!) 163/112  ?   Pulse Rate 05/01/21 1508 (!) 58  ?   Resp 05/01/21 1508 (!) 30  ?   Temp 05/01/21 1515 (!) 89.3 ?F (31.8 ?C)  ?   Temp Source 05/01/21 1515 Rectal  ?   SpO2 05/01/21 1508 92 %  ?   Weight 05/01/21 1505 149 lb 7.6 oz (67.8 kg)  ?   Height --   ?   Head Circumference --   ?   Peak Flow --   ?   Pain Score --   ?   Pain Loc --   ?   Pain Edu? --   ?   Excl. in GC? --   ? ? ?Most recent vital signs: ?Vitals:  ? 05/01/21 1845 05/01/21 1900  ?BP: (!) 69/45 (!) 94/48  ?Pulse: 90 63  ?Resp: (!) 31 (!) 30  ?Temp: (!) 92.1 ?F (33.4 ?C) (!) 92.4 ?F (33.6 ?C)  ?SpO2: 93% 93%  ? ? ?General: Alert, minimally verbal, ill-appearing.  Follows commands. ?CV:  Good peripheral perfusion.  ?Resp:  Slightly increased respiratory effort.  Significant diffuse rales and rhonchi bilaterally. ?Abd:  Soft with no focal tenderness.  No distention.  ?Other:  3+ bilateral lower extremity edema.  No erythema, induration, or abnormal warmth.  Motor intact in all extremities.  Dry mucous membranes. ? ? ?ED Results / Procedures / Treatments  ? ?Labs ?(all labs ordered are listed, but only abnormal results are displayed) ?Labs Reviewed  ?COMPREHENSIVE METABOLIC PANEL - Abnormal; Notable for the following components:  ?     Result Value  ? CO2 19 (*)   ? BUN 89 (*)   ? Creatinine, Ser 2.84 (*)   ? Calcium 7.7 (*)   ? Total Protein 6.2 (*)   ? Albumin 2.1 (*)   ? Alkaline Phosphatase 236 (*)   ? GFR, Estimated 20 (*)   ? All other components within normal limits  ?CBC - Abnormal; Notable for the following components:  ? RBC 2.42 (*)   ? Hemoglobin 8.6 (*)   ? HCT 26.4 (*)   ? MCV 109.1 (*)   ? MCH 35.5 (*)   ? RDW 17.9 (*)   ? Platelets 17 (*)   ? nRBC 0.3 (*)   ? All other components within normal limits  ?PROTIME-INR - Abnormal; Notable for the following components:  ? Prothrombin Time 19.9 (*)   ? INR 1.7 (*)   ? All other components within normal limits  ?BLOOD GAS, VENOUS - Abnormal; Notable for the following components:  ? pCO2, Ven 38 (*)   ? Acid-base deficit 5.4 (*)   ? All other  components within normal limits  ?LACTIC ACID, PLASMA - Abnormal; Notable for the following components:  ? Lactic Acid, Venous 4.4 (*)   ? All other components within normal limits  ?URINALYSIS, COMPLETE (UACMP) WITH MICROSCOPIC - Abnormal; Notable for the following components:  ? Color, Urine AMBER (*)   ? APPearance CLOUDY (*)   ? Hgb urine dipstick LARGE (*)   ? Protein, ur 30 (*)   ? Leukocytes,Ua SMALL (*)   ? RBC / HPF >50 (*)   ? WBC, UA >50 (*)   ? Bacteria, UA RARE (*)   ? Non Squamous Epithelial PRESENT (*)   ? All other components within normal limits  ?RESP PANEL BY RT-PCR (FLU A&B, COVID) [id number]  ?CULTURE, BLOOD (ROUTINE X 2)  ?CULTURE, BLOOD (ROUTINE X 2)  ?URINE CULTURE  ?LACTIC ACID, PLASMA  ?PROCALCITONIN  ?PROCALCITONIN  ?FIBRINOGEN  ?TSH  ?T4, FREE  ?LEGIONELLA PNEUMOPHILA SEROGP 1 UR AG  ?BRAIN NATRIURETIC PEPTIDE  ?PATHOLOGIST SMEAR REVIEW  ?D-DIMER, QUANTITATIVE  ?APTT  ?BASIC METABOLIC PANEL  ?CBC  ?MAGNESIUM  ?PHOSPHORUS  ?CBG MONITORING, ED  ?CBG MONITORING, ED  ? ? ? ?EKG ? ?ED ECG REPORT ?IDionne Bucy, the attending physician, personally viewed and interpreted this ECG. ? ?Date: 05/01/2021 ?EKG Time: 1534 ?Rate:  49 ?Rhythm: Sinus bradycardia ?QRS Axis: normal ?Intervals: Prolonged PR, borderline prolonged QTc ?ST/T Wave abnormalities: Nonspecific T wave flattening ?Narrative Interpretation: Nonspecific abnormalities with no evidence of acute ischemia ? ? ? ?RADIOLOGY ? ?Chest x-ray: I independently viewed and interpreted the images;  ? ?PROCEDURES: ? ?Critical Care performed: Yes, see critical care procedure note(s) ? ?.Critical Care ?Performed by: Dionne Bucy, MD ?Authorized by: Dionne Bucy, MD  ? ?Critical care provider statement:  ?  Critical care time (minutes):  60 ?  Critical care was necessary to treat or prevent imminent or life-threatening deterioration of the following conditions:  Sepsis ?  Critical care was time spent personally by me on the following activities:  Development of treatment plan with patient or surrogate, discussions with consultants, evaluation of patient's response to treatment, examination of patient, ordering and review of laboratory studies, ordering and review of radiographic studies, ordering and performing treatments and interventions, pulse oximetry, re-evaluation of patient's condition and review of old charts ? ? ?MEDICATIONS ORDERED IN ED: ?Medications  ?docusate sodium (COLACE) capsule 100 mg (has no administration in time range)  ?polyethylene glycol (MIRALAX / GLYCOLAX) packet 17 g (has no administration in time range)  ?ondansetron (ZOFRAN) injection 4 mg (has no administration in time range)  ?ipratropium-albuterol (DUONEB) 0.5-2.5 (3) MG/3ML nebulizer solution 3 mL (has no administration in time range)  ?0.9 %  sodium chloride infusion (has no administration in time range)  ?norepinephrine (LEVOPHED) 4mg  in (0.016 mg/mL) premix infusion (has no administration in time range)  ?lactated ringers bolus 500 mL (has no administration in time range)  ?lactated ringers bolus 1,000 mL (0 mLs Intravenous Stopped 05/01/21 1644)  ?vancomycin (VANCOCIN) IVPB 1000 mg/200 mL  premix (0 mg Intravenous Stopped 05/01/21 1733)  ?ceFEPIme (MAXIPIME) 2 g in sodium chloride 0.9 % 100 mL IVPB (0 g Intravenous Stopped 05/01/21 1629)  ?lactated ringers bolus 1,000 mL (0 mLs Intravenous Stopped 05/01/21 1736)  ?morphine (PF) 2 MG/ML injection 2 mg (2 mg Intravenous Given 05/01/21 1839)  ? ? ? ?IMPRESSION / MDM / ASSESSMENT AND PLAN / ED COURSE  ?I reviewed the triage vital signs and the nursing notes. ? ?88 year old male with PMH as noted above  presents with worsening shortness of breath, hypoxia, and altered mental status after recently being diagnosed with pneumonia. ? ?I reviewed the past medical records.  The patient was admitted about a year ago and per the hospitalist discharge summary from 04/01/2020 he was evaluated for recurrent syncopal episodes.  He has been followed by palliative care and per the note from 05/07/2020, the patient is DNR. ? ?On exam the patient is ill and weak appearing.  He is significantly hypothermic, borderline bradycardia, and slightly tachypneic.  O2 saturation is in the mid 90s on 10 L O2 by nonrebreather.  There are diffuse rales bilaterally.  Mucous membranes are dry.  There is significant lower extremity edema which appears chronic.  There are no focal neurologic findings; the patient is able to follow some commands although I am unable to perform thorough neurologic exam. ? ?Differential diagnosis includes, but is not limited to, pneumonia or other acute source of infection/sepsis, severe dehydration, AKI, or other metabolic disturbance, or less likely primary cardiac cause. ? ?We have placed the patient under a Bair hugger, placed a temperature sensing Foley, and we will give warm fluids.  We will obtain lab work-up, give empiric antibiotics, and reassess.  I anticipate admission. ? ?The patient is on the cardiac monitor to evaluate for evidence of arrhythmia and/or significant heart rate changes. ? ?----------------------------------------- ?3:59 PM on  05/01/2021 ?----------------------------------------- ? ?I had extensive discussion with the patient's stepson who is his only family member and caretaker.  He confirms that the patient is DNR and DNI.  The patient is cu

## 2021-05-01 NOTE — ED Notes (Signed)
Pt placed on 10l/min via NRB. Pt o2 sat in the low 80's with [place]. Pt has audible crackles. ?

## 2021-05-01 NOTE — Progress Notes (Signed)
Elink following for sepsis protocol. 

## 2021-05-01 NOTE — ED Triage Notes (Signed)
Pt arrives via EMS from [facility] with confirmed pneumonia with two rounds on abx with no improvement. Pt has had a decline within the past day. EMS placed 6l/min of O2 via  due to oxygen sats in low low 80's. Pt is currently mid 107's. Pt baseline is A/Ox2. Pt is currently non verbal. ?

## 2021-05-01 NOTE — Consult Note (Signed)
PHARMACY CONSULT NOTE - FOLLOW UP ? ?Pharmacy Consult for Electrolyte Monitoring and Replacement  ? ?Recent Labs: ?Potassium (mmol/L)  ?Date Value  ?05/01/2021 4.4  ?01/30/2013 3.6  ? ?Magnesium (mg/dL)  ?Date Value  ?04/01/2020 1.8  ? ?Calcium (mg/dL)  ?Date Value  ?05/01/2021 7.7 (L)  ? ?Calcium, Total (mg/dL)  ?Date Value  ?01/30/2013 8.3 (L)  ? ?Albumin (g/dL)  ?Date Value  ?05/01/2021 2.1 (L)  ?01/28/2013 3.6  ? ?Sodium (mmol/L)  ?Date Value  ?05/01/2021 140  ?01/30/2013 134 (L)  ? ? ? ?Assessment: ?88yo M w/ h/o severe PAD w/ bilateral wounds/foot ulcerations, alzheimer's dementia, chronic anemia & worsened chronic thrombocytopenia admitted for septic shock 2/2 CAP. Pharmacy consulted for electrolyte mgmt. ? ?Ca (corrected): 9.2mg /dL (alb 2.1) ? ?Goal of Therapy:  ?Lytes WNL ? ?Plan:  ?Currently lytes are WNL not requiring repletion, but will watch closely ISO AKI Scr 2.84 (03/2020 was 1.24). ?Will CTM w/ AM labs daily and replace PRN. ? ?Lorna Dibble ,PharmD ?Clinical Pharmacist ?05/01/2021 7:01 PM ? ?

## 2021-05-01 NOTE — Progress Notes (Signed)
eLink Physician-Brief Progress Note ?Patient Name: Jeffrey Nielsen ?DOB: 1926/04/23 ?MRN: [id number] ? ? ?Date of Service ? 05/01/2021  ?HPI/Events of Note ? 46M with recent pneumonia who presented with worsening hypoxemia and hypotension. CXR with bilateral infiltrate and pulmonary edema. Admitted to [facility] ICU. ? ?On camera check, elderly frail male on heated high flow FIO2 50% 30L with 93%. Levo 10 on PIV  ?eICU Interventions ? Primary team will discuss GOC with family if pressor requirement continues to increase ?--Continue Vanc/Cefepime, IVF resuscitation and vasopressor support ?--Elink available as needed  ? ? ? ?  ? ?Jeffrey Nielsen ?05/01/2021, 8:46 PM ?

## 2021-05-02 ENCOUNTER — Inpatient Hospital Stay: Payer: Medicare Other

## 2021-05-02 DIAGNOSIS — D696 Thrombocytopenia, unspecified: Secondary | ICD-10-CM | POA: Diagnosis not present

## 2021-05-02 DIAGNOSIS — R4182 Altered mental status, unspecified: Secondary | ICD-10-CM | POA: Diagnosis not present

## 2021-05-02 DIAGNOSIS — J189 Pneumonia, unspecified organism: Secondary | ICD-10-CM | POA: Diagnosis not present

## 2021-05-02 DIAGNOSIS — A419 Sepsis, unspecified organism: Secondary | ICD-10-CM | POA: Diagnosis not present

## 2021-05-02 DIAGNOSIS — J9601 Acute respiratory failure with hypoxia: Secondary | ICD-10-CM | POA: Diagnosis present

## 2021-05-02 DIAGNOSIS — E872 Acidosis, unspecified: Secondary | ICD-10-CM | POA: Diagnosis present

## 2021-05-02 LAB — PHOSPHORUS: Phosphorus: 5.7 mg/dL — ABNORMAL HIGH (ref 2.5–4.6)

## 2021-05-02 LAB — GLUCOSE, CAPILLARY
Glucose-Capillary: 101 mg/dL — ABNORMAL HIGH (ref 70–99)
Glucose-Capillary: 68 mg/dL — ABNORMAL LOW (ref 70–99)
Glucose-Capillary: 73 mg/dL (ref 70–99)
Glucose-Capillary: 76 mg/dL (ref 70–99)
Glucose-Capillary: 93 mg/dL (ref 70–99)
Glucose-Capillary: 104 mg/dL — ABNORMAL HIGH (ref 70–99)
Glucose-Capillary: 123 mg/dL — ABNORMAL HIGH (ref 70–99)
Glucose-Capillary: 119 mg/dL — ABNORMAL HIGH (ref 70–99)

## 2021-05-02 LAB — BLOOD CULTURE ID PANEL (REFLEXED) - BCID2

## 2021-05-02 LAB — BASIC METABOLIC PANEL
Anion gap: 13 (ref 5–15)
BUN: 89 mg/dL — ABNORMAL HIGH (ref 8–23)
CO2: 18 mmol/L — ABNORMAL LOW (ref 22–32)
Calcium: 7.4 mg/dL — ABNORMAL LOW (ref 8.9–10.3)
Chloride: 106 mmol/L (ref 98–111)
Creatinine, Ser: 2.97 mg/dL — ABNORMAL HIGH (ref 0.61–1.24)
GFR, Estimated: 19 mL/min — ABNORMAL LOW (ref >60)
Glucose, Bld: 84 mg/dL (ref 70–99)
Potassium: 4.7 mmol/L (ref 3.5–5.1)
Sodium: 137 mmol/L (ref 135–145)

## 2021-05-02 LAB — CBC
HCT: 26.4 % — ABNORMAL LOW (ref 39.0–52.0)
Hemoglobin: 9.1 g/dL — ABNORMAL LOW (ref 13.0–17.0)
MCH: 36.3 pg — ABNORMAL HIGH (ref 26.0–34.0)
MCHC: 34.5 g/dL (ref 30.0–36.0)
MCV: 105.2 fL — ABNORMAL HIGH (ref 80.0–100.0)
Platelets: 38 10*3/uL — ABNORMAL LOW (ref 150–400)
RBC: 2.51 MIL/uL — ABNORMAL LOW (ref 4.22–5.81)
RDW: 17.5 % — ABNORMAL HIGH (ref 11.5–15.5)
WBC: 11.9 10*3/uL — ABNORMAL HIGH (ref 4.0–10.5)
nRBC: 0.5 % — ABNORMAL HIGH (ref 0.0–0.2)

## 2021-05-02 LAB — TECHNOLOGIST SMEAR REVIEW

## 2021-05-02 LAB — MAGNESIUM: Magnesium: 2.2 mg/dL (ref 1.7–2.4)

## 2021-05-02 LAB — LACTIC ACID, PLASMA
Lactic Acid, Venous: 2.4 mmol/L (ref 0.5–1.9)
Lactic Acid, Venous: 2.3 mmol/L (ref 0.5–1.9)

## 2021-05-02 LAB — VALPROIC ACID LEVEL: Valproic Acid Lvl: <10 ug/mL — ABNORMAL LOW (ref 50.0–100.0)

## 2021-05-02 LAB — EXPECTORATED SPUTUM ASSESSMENT W GRAM STAIN, RFLX TO RESP C

## 2021-05-02 LAB — PROCALCITONIN: Procalcitonin: 1.58 ng/mL

## 2021-05-02 LAB — STREP PNEUMONIAE URINARY ANTIGEN: Strep Pneumo Urinary Antigen: NEGATIVE

## 2021-05-02 LAB — CORTISOL-AM, BLOOD: Cortisol - AM: 22.8 ug/dL — ABNORMAL HIGH (ref 6.7–22.6)

## 2021-05-02 MED ORDER — PANTOPRAZOLE SODIUM 40 MG IV SOLR
40.0000 mg | INTRAVENOUS | Status: DC
  Administered 2021-05-02 – 2021-05-04 (×3): 40 mg via INTRAVENOUS
  Filled 2021-05-02 (×3): qty 10

## 2021-05-02 MED ORDER — HYDROCORTISONE SOD SUC (PF) 100 MG IJ SOLR
100.0000 mg | Freq: Two times a day (BID) | INTRAMUSCULAR | Status: DC
  Administered 2021-05-02 – 2021-05-04 (×5): 100 mg via INTRAVENOUS
  Filled 2021-05-02 (×5): qty 2

## 2021-05-02 MED ORDER — VALPROATE SODIUM 100 MG/ML IV SOLN: 125.0000 mg | Freq: Two times a day (BID) | INTRAVENOUS | Status: DC

## 2021-05-02 MED ORDER — LEVOTHYROXINE SODIUM 112 MCG PO TABS
112.0000 ug | ORAL_TABLET | Freq: Every day | ORAL | Status: DC
  Filled 2021-05-02 (×3): qty 1

## 2021-05-02 MED ORDER — VALPROIC ACID 250 MG/5ML PO SOLN
125.0000 mg | Freq: Two times a day (BID) | ORAL | Status: DC
  Filled 2021-05-02 (×3): qty 5

## 2021-05-02 MED ORDER — IPRATROPIUM-ALBUTEROL 0.5-2.5 (3) MG/3ML IN SOLN
3.0000 mL | Freq: Four times a day (QID) | RESPIRATORY_TRACT | Status: DC
  Administered 2021-05-02 – 2021-05-04 (×8): 3 mL via RESPIRATORY_TRACT
  Filled 2021-05-02 (×8): qty 3

## 2021-05-02 MED ORDER — CHLORHEXIDINE GLUCONATE CLOTH 2 % EX PADS
6.0000 | MEDICATED_PAD | Freq: Every day | CUTANEOUS | Status: DC
  Administered 2021-05-02 – 2021-05-05 (×3): 6 via TOPICAL

## 2021-05-02 MED ORDER — LACTATED RINGERS IV BOLUS (SEPSIS)
500.0000 mL | Freq: Once | INTRAVENOUS | Status: AC
  Administered 2021-05-02: 500 mL via INTRAVENOUS

## 2021-05-02 MED ORDER — FLUDROCORTISONE ACETATE 0.1 MG PO TABS
0.1000 mg | ORAL_TABLET | Freq: Every day | ORAL | Status: DC
  Filled 2021-05-02: qty 1

## 2021-05-02 MED ORDER — DONEPEZIL HCL 5 MG PO TABS
10.0000 mg | ORAL_TABLET | Freq: Every day | ORAL | Status: DC
  Filled 2021-05-02 (×2): qty 2

## 2021-05-02 MED ORDER — DIVALPROEX SODIUM 125 MG PO CSDR
125.0000 mg | DELAYED_RELEASE_CAPSULE | Freq: Two times a day (BID) | ORAL | Status: DC
  Filled 2021-05-02: qty 1

## 2021-05-02 MED ORDER — VITAMIN B-12 1000 MCG PO TABS: 1000.0000 ug | ORAL_TABLET | Freq: Every day | ORAL | Status: DC

## 2021-05-02 NOTE — Progress Notes (Signed)
? ?NAME:  Jeffrey Nielsen, MRN:  [id number], DOB:  [birth year], LOS: 1 ?ADMISSION DATE:  05/01/2021, CONSULTATION DATE: 05/01/2021 ?REFERRING MD: Dr. Cherylann Banas, CHIEF COMPLAINT: Pneumonia  ? ?History of Present Illness:  ?This is a 88 yo male who presented to [facility] ER via EMS from [facility] with confirmed pneumonia dx via CXR a few days ago.  Rocephin x2 doses initiated at facility, however symptoms worsened with O2 sats in the low 80's on RA prompting ER visit.  HPI obtained from chart pt unable to participate due to severe respiratory distress along with acute encephalopathy. ? ?ED Course ?Upon arrival to the ER pt significantly hypothermic temp 89.3 degrees F rectally, bradycardic hr 49-50's, and hypotensive map 54-60's.  Pt also hypoxic requiring 10L/min via NRB.  COVID-19/Influenza A&B by PCR negative, however CXR revealed widespread pneumonia along with possible pulmonary edema.  Lab results revealed CO2 19, BUN 89, creatinine 2.84, alk phos 236, albumin 2.1, lactic acid 4.4, hgb 8.6, platelets 17, and PT 19.9/INR 1.7.  UA with small leukocytes/rare bacteria/>50 rbc/>50 wbc.  Sepsis protocol initiated pt received 2L LR bolus, cefepime, and vancomycin.  Due to continued hypotension levophed gtt initiated.  PCCM team contacted for ICU admission.  ? 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 ?Significant Hospital Events: ?Including procedures, antibiotic start and stop dates in addition to other pertinent events   ?04/7: Pt admitted to ICU with acute kidney injury, severe sepsis, and acute hypoxic respiratory failure secondary to widespread pneumonia requiring levophed gtt  ?4/8: remains on peripheral pressors, body temperature normalized on Bair hugger, on HHFNC ? ?Interim History / Subjective:  ?Tolerating HHFNC. Bair hugger in place for hypothermia. He remains on  peripheral Levophed at 8. Renal function remains poor; minimal urine output.  ? ?Objective   ?Blood pressure (!) 89/69, pulse 72, temperature (!) 96.3 ?F (35.7 ?C), resp. rate (!) 26, height $RemoveBe'6\' 4"'xIkWpELOx$  (1.93 m), weight 108 kg, SpO2 95 %. ?   ?FiO2 (%):  [50 %-60 %] 50 %  ? ?Intake/Output Summary (Last 24 hours) at 05/02/2021 0913 ?Last data filed at 05/02/2021 0610 ?Gross per 24 hour  ?Intake 599.6 ml  ?Output 23 ml  ?Net 576.6 ml  ? ?Filed Weights  ? 05/01/21 1505 05/01/21 2000 05/02/21 0500  ?Weight: 67.8 kg 108 kg 108 kg  ? ? ?Examination: ?General: acute on chronically ill appearing Elderly male ?Lungs: Diffuse rhonchi along with crackles ?Cardiovascular: RRR, s1s2, no r/g, 1+ radial/1+ distal pulses, 3+bilateral lower extremity pitting edema, 2+ bilateral upper extremity edema  ?Abdomen: +BS x4, obese, soft, non tender, non distended  ?Extremities: right heel ulceration along with bilateral lower extremity vascular changes.  ?Neuro: awake, not oriented ?GU: Indwelling foley catheter draining tea colored urine  ? ?Resolved Hospital Problem list   ? ? ?Assessment & Plan:  ?Acute hypoxic respiratory failure secondary to multifocal pneumonia  ?- Supplemental O2 for dyspnea and/or hypoxia  ?- Prn bronchodilator therapy  ?- Trend WBC and monitor fever curve  ?- Trend PCT  ?- Follow cultures; collect sputum culture if able ?- Continue vancomycin and cefepime for now deescalate pending cultures ?- Chest PT q4 ? ?Septic shock ?Lactic acidosis, improving ?- Continuous telemetry monitoring  ?- Peripheral levophed gtt to maintain map >65 ?- Add stress dose steroids as patient on Florinef at baseline ?- Send cortisol from AM labs ?- Trend lactate to close; gentle volume  resuscitation ? ?Acute kidney injury secondary to ATN in setting sepsis  ?- Trend BMP ?- Replace electrolytes as indicated  ?- Monitor UOP ?- Avoid nephrotoxic medications ? ?Acute on chronic thrombocytopenia in setting sepsis  ?- Trend CBC ?- DIC panel and  peripheral smear pending  ?- Monitor for s/sx of bleeding and transfuse platelets and/or pRBC's for active signs of bleeding ? ?Hypothermia ?- Prn bearhugger to maintain normothermia  ? 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 ?Acute toxic encephalopathy  ?- Supportive care  ?- Frequent reorientation  ?- Resume home Depakote, Aricept ? ?Hypothyroidism ?- Continue home Synthroid when able ? ?Best Practice (right click and "Reselect all SmartList Selections" daily)  ? ?Diet/type: NPO; place NGT ?DVT prophylaxis: SCD ?GI prophylaxis: PPI ?Lines: N/A ?Foley:  Yes, and it is still needed ?Code Status:  DNR ?Last date of multidisciplinary goals of care discussion [4/8] ? ?Discussed plan of care with pts step-son and proxy [name] at bedside he confirmed pt is DNR/DNI.  He states he would like to continue current plan of care as outlined above, however he does want to keep the pt comfortable.  Discussed if pt declines and does not respond to treatment will need to consider transitioning to Comfort Measures Only. ?Labs   ?CBC: ?Recent Labs  ?Lab 05/01/21 ?1511 05/02/21 ?7948  ?WBC 6.8 11.9*  ?HGB 8.6* 9.1*  ?HCT 26.4* 26.4*  ?MCV 109.1* 105.2*  ?PLT 17* 38*  ? ? ?Basic Metabolic Panel: ?Recent Labs  ?Lab 05/01/21 ?1511 05/02/21 ?0165  ?NA 140 137  ?K 4.4 4.7  ?CL 108 106  ?CO2 19* 18*  ?GLUCOSE 88 84  ?BUN 89* 89*  ?CREATININE 2.84* 2.97*  ?CALCIUM 7.7* 7.4*  ?MG  --  2.2  ?PHOS  --  5.7*  ? ?GFR: ?Estimated Creatinine Clearance: 21.4 mL/min (A) (by C-G formula based on SCr of 2.97 mg/dL (H)). ?Recent Labs  ?Lab 05/01/21 ?1511 05/01/21 ?1532 05/01/21 ?2014 05/02/21 ?5374  ?PROCALCITON  --   --  1.41 1.58  ?WBC 6.8  --   --  11.9*  ?LATICACIDVEN  --  4.4* 4.6* 2.4*  ? ? ?Liver Function Tests: ?Recent Labs  ?Lab 05/01/21 ?1511  ?AST 41  ?ALT 38  ?ALKPHOS 236*  ?BILITOT 1.2  ?PROT 6.2*   ?ALBUMIN 2.1*  ? ?No results for input(s): LIPASE, AMYLASE in the last 168 hours. ?No results for input(s): AMMONIA in the last 168 hours. ? ?ABG ?   ?Component Value Date/Time  ? HCO3 20.0 05/01/2021 1540  ? ACIDBASEDEF 5.4 (H) 05/01/2021 1540  ? O2SAT 69.7 05/01/2021 1540  ?  ? ?Coagulation Profile: ?Recent Labs  ?Lab 05/01/21 ?1511  ?INR 1.7*  ? ? ?Cardiac Enzymes: ?No results for input(s): CKTOTAL, CKMB, CKMBINDEX, TROPONINI in the last 168 hours. ? ?HbA1C: ?No results found for: HGBA1C ? ?CBG: ?Recent Labs  ?Lab 05/01/21 ?2329 05/02/21 ?0210 05/02/21 ?8270 05/02/21 ?7867 05/02/21 ?5449  ?GLUCAP 74 101* 68* 73 76  ? ? ?Review of Systems:   ?Unable to assess due to acute encephalopathy  ? ?Past Medical History:  ?He,  has a past medical history of HTN (hypertension) and Hypothyroidism.  ? ?Surgical History:  ? ?Past Surgical History:  ?Procedure Laterality Date  ? CERVICAL DISC SURGERY    ?  ? ?Social History:  ? reports that he has never smoked. He has never used smokeless tobacco. He  reports that he does not currently use alcohol. He reports that he does not use drugs.  ? ?Family History:  ?His family history is not on file.  ? ?Allergies ?No Known Allergies  ? ?Home Medications  ?Prior to Admission medications   ?Medication Sig Start Date End Date Taking? Authorizing Provider  ?ALOE VESTA CLEAR ANTIFUNGAL 2 % OINT Apply 1 application. topically 3 (three) times daily as needed (nail fungus).   Yes [provider]  ?Amino Acids-Protein Hydrolys (PRO-STAT AWC) LIQD Take 30 mLs by mouth daily.   Yes [provider]  ?cefTRIAXone (ROCEPHIN) IVPB Inject 1 g into the vein daily. 04/30/21 05/06/21 Yes [provider]  ?divalproex (DEPAKOTE) 125 MG DR tablet Take 125 mg by mouth 2 (two) times daily. 03/18/20  Yes [provider]  ?donepezil (ARICEPT) 10 MG tablet Take 10 mg by mouth at bedtime. 03/18/20  Yes [provider]  ?fludrocortisone (FLORINEF) 0.1 MG tablet Take 1  tablet (0.1 mg total) by mouth daily. 04/01/20  Yes Sharen Hones, MD  ?guaifenesin (HUMIBID E) 400 MG TABS tablet Take 400 mg by mouth 3 (three) times daily. 04/29/21 05/06/21 Yes [provider]  ?ipratropium-albuterol (D

## 2021-05-02 NOTE — Consult Note (Signed)
PHARMACY - PHYSICIAN COMMUNICATION ?CRITICAL VALUE ALERT - BLOOD CULTURE IDENTIFICATION (BCID) ? ?Jeffrey Nielsen is an 88 y.o. male h/o severe PAD w/ bilateral wounds/foot ulcerations, alzheimer's dementia, chronic anemia & worsened chronic thrombocytopenia admitted on 05/01/2021 for septic shock 2/2 HCAP. ? ?Assessment:  BCID returned 1/4 bottles (aerobic). GPC, Staph species detected. Patient is currently on D1 of Vancomycin, so no change in therapy is required. Since only 1 of 4, unclear if possible contaminant, could consider repeat to rule out or wait for speciation. ? ?Name of physician (or Provider) Contacted: Marcelo Baldy, MD ? ?Current antibiotics: IV Vancomycin  ? ?Changes to prescribed antibiotics recommended:  ?MD placed orders for repeat blood cultures for possible contamination ?Continue vancomycin therapy ? ?No results found for this or any previous visit. ? ?Thank you for allowing pharmacy to be a part of this patient?s care. ? ?Selinda Eon, PharmD, MS PGPM ?Clinical Pharmacist ?05/02/2021 ?5:23 PM ? ? ?

## 2021-05-02 NOTE — Consult Note (Signed)
Pharmacy Antibiotic Note ? ?Jeffrey Nielsen is a 88 y.o. male w/ h/o severe PAD w/ bilateral wounds/foot ulcerations, alzheimer's dementia, chronic anemia & worsened chronic thrombocytopenia admitted on 05/01/2021 for septic shock 2/2 HCAP.  Pharmacy has been consulted for Vancomycin & cefepime dosing. ? ?Plan: ?cefepime 2g IV q24h   Crcl 21.4 ml/min ?Vancomycin 1g x1 in ED (4/07 1630) will give an additional 1.5g x1 now (4/07 2200) to complete loading dose of 2.5g. (Current renal fxn gives dosing interval of 36-48hrs ISO AKI. Will hold off scheduling doses to see if renal fxn recovers or if dosing by levels is more appropriate.) ? ? ?4/8:  Will order random Vancomycin level for 4/9 with am labs (~36hrs post initial dose).  Scr 2.84>>2.97 ? ? ? ?Height: 6\' 4"  (193 cm) ?Weight: 108 kg (238 lb 1.6 oz) ?IBW/kg (Calculated) : 86.8 ? ?Temp (24hrs), Avg:94.1 ?F (34.5 ?C), Min:88.2 ?F (31.2 ?C), Max:97.7 ?F (36.5 ?C) ? ?Recent Labs  ?Lab 05/01/21 ?1511 05/01/21 ?1532 05/01/21 ?2014 05/02/21 ?07/02/21  ?WBC 6.8  --   --  11.9*  ?CREATININE 2.84*  --   --  2.97*  ?LATICACIDVEN  --  4.4* 4.6* 2.4*  ? ?  ?Estimated Creatinine Clearance: 21.4 mL/min (A) (by C-G formula based on SCr of 2.97 mg/dL (H)).   ? ?No Known Allergies ? ?Antimicrobials this admission: ?Vancomycin & cefepime (4/07 >>  ? ?Dose adjustments this admission: ?CTM closely ISO AKI. Scr 2.84 (baseline of 1.24 in 03/2020) ? ?Microbiology results: ?4/07 BCx: pending ?4/07 UCx: pending  ?4/07 Flu/Cov: negative  ?4/07 MRSA PCR: ordered ? ?Thank you for allowing pharmacy to be a part of this patient?s care. ? ?Antoninette Lerner A ?05/02/2021 9:52 AM ? ?

## 2021-05-02 NOTE — Consult Note (Signed)
PHARMACY CONSULT NOTE - FOLLOW UP ? ?Pharmacy Consult for Electrolyte Monitoring and Replacement  ? ?Recent Labs: ?Potassium (mmol/L)  ?Date Value  ?05/02/2021 4.7  ?01/30/2013 3.6  ? ?Magnesium (mg/dL)  ?Date Value  ?05/02/2021 2.2  ? ?Calcium (mg/dL)  ?Date Value  ?05/02/2021 7.4 (L)  ? ?Calcium, Total (mg/dL)  ?Date Value  ?01/30/2013 8.3 (L)  ? ?Albumin (g/dL)  ?Date Value  ?05/01/2021 2.1 (L)  ?01/28/2013 3.6  ? ?Phosphorus (mg/dL)  ?Date Value  ?05/02/2021 5.7 (H)  ? ?Sodium (mmol/L)  ?Date Value  ?05/02/2021 137  ?01/30/2013 134 (L)  ? ? ? ?Assessment: ?88yo M w/ h/o severe PAD w/ bilateral wounds/foot ulcerations, alzheimer's dementia, chronic anemia & worsened chronic thrombocytopenia admitted for septic shock 2/2 CAP. Pharmacy consulted for electrolyte mgmt. ? ?Ca (corrected): 8.9 mg/dL (alb 2.1) ? ?Goal of Therapy:  ?Lytes WNL ? ?Plan:  ?Currently lytes are WNL not requiring repletion, but will watch closely ISO AKI Scr 2.97 (03/2020 was 1.24). ?Will CTM w/ AM labs daily and replace PRN. ? ?Angelique Blonder ,PharmD ?Clinical Pharmacist ?05/02/2021 9:46 AM ? ?

## 2021-05-02 NOTE — Plan of Care (Signed)
  Problem: Clinical Measurements: Goal: Ability to maintain clinical measurements within normal limits will improve Outcome: Progressing   

## 2021-05-03 ENCOUNTER — Inpatient Hospital Stay: Payer: Medicare Other

## 2021-05-03 DIAGNOSIS — R6521 Severe sepsis with septic shock: Secondary | ICD-10-CM | POA: Diagnosis not present

## 2021-05-03 DIAGNOSIS — A419 Sepsis, unspecified organism: Secondary | ICD-10-CM | POA: Diagnosis not present

## 2021-05-03 LAB — CBC
HCT: 23.8 % — ABNORMAL LOW (ref 39.0–52.0)
Hemoglobin: 8.2 g/dL — ABNORMAL LOW (ref 13.0–17.0)
MCH: 36.3 pg — ABNORMAL HIGH (ref 26.0–34.0)
MCHC: 34.5 g/dL (ref 30.0–36.0)
MCV: 105.3 fL — ABNORMAL HIGH (ref 80.0–100.0)
Platelets: 30 10*3/uL — ABNORMAL LOW (ref 150–400)
RBC: 2.26 MIL/uL — ABNORMAL LOW (ref 4.22–5.81)
RDW: 17.4 % — ABNORMAL HIGH (ref 11.5–15.5)
WBC: 9.5 10*3/uL (ref 4.0–10.5)
nRBC: 0.3 % — ABNORMAL HIGH (ref 0.0–0.2)

## 2021-05-03 LAB — CULTURE, BLOOD (ROUTINE X 2)

## 2021-05-03 LAB — GLUCOSE, CAPILLARY
Glucose-Capillary: 120 mg/dL — ABNORMAL HIGH (ref 70–99)
Glucose-Capillary: 126 mg/dL — ABNORMAL HIGH (ref 70–99)
Glucose-Capillary: 130 mg/dL — ABNORMAL HIGH (ref 70–99)
Glucose-Capillary: 135 mg/dL — ABNORMAL HIGH (ref 70–99)
Glucose-Capillary: 137 mg/dL — ABNORMAL HIGH (ref 70–99)
Glucose-Capillary: 138 mg/dL — ABNORMAL HIGH (ref 70–99)

## 2021-05-03 LAB — BASIC METABOLIC PANEL
Anion gap: 11 (ref 5–15)
BUN: 93 mg/dL — ABNORMAL HIGH (ref 8–23)
CO2: 19 mmol/L — ABNORMAL LOW (ref 22–32)
Calcium: 7.4 mg/dL — ABNORMAL LOW (ref 8.9–10.3)
Chloride: 109 mmol/L (ref 98–111)
Creatinine, Ser: 2.97 mg/dL — ABNORMAL HIGH (ref 0.61–1.24)
GFR, Estimated: 19 mL/min — ABNORMAL LOW (ref >60)
Glucose, Bld: 130 mg/dL — ABNORMAL HIGH (ref 70–99)
Potassium: 4.6 mmol/L (ref 3.5–5.1)
Sodium: 139 mmol/L (ref 135–145)

## 2021-05-03 LAB — URINE CULTURE: Culture: NO GROWTH

## 2021-05-03 LAB — PHOSPHORUS: Phosphorus: 5.6 mg/dL — ABNORMAL HIGH (ref 2.5–4.6)

## 2021-05-03 LAB — LACTIC ACID, PLASMA: Lactic Acid, Venous: 1.9 mmol/L (ref 0.5–1.9)

## 2021-05-03 LAB — VANCOMYCIN, RANDOM: Vancomycin Rm: 18

## 2021-05-03 LAB — PROCALCITONIN: Procalcitonin: 1.55 ng/mL

## 2021-05-03 LAB — MAGNESIUM: Magnesium: 2.3 mg/dL (ref 1.7–2.4)

## 2021-05-03 MED ORDER — VANCOMYCIN HCL IN DEXTROSE 1-5 GM/200ML-% IV SOLN
1000.0000 mg | Freq: Once | INTRAVENOUS | Status: AC
  Administered 2021-05-03: 1000 mg via INTRAVENOUS
  Filled 2021-05-03: qty 200

## 2021-05-03 MED ORDER — SODIUM BICARBONATE 8.4 % IV SOLN
INTRAVENOUS | Status: AC
  Filled 2021-05-03: qty 150

## 2021-05-03 NOTE — Plan of Care (Signed)
Continuing with plan of care. 

## 2021-05-03 NOTE — Progress Notes (Signed)
Attempted to place NG tube x 3, first attempt KUB verified NG tube was coiled in the esophagus, second and third attempt NG tube would not progress down and came out of patient's mouth.  Dr. Arlean Hopping updated on NG tube placement status, awaiting further instruction.  Patient tolerated well and resting at this time. ?

## 2021-05-03 NOTE — Consult Note (Signed)
PHARMACY CONSULT NOTE - FOLLOW UP ? ?Pharmacy Consult for Electrolyte Monitoring and Replacement  ? ?Recent Labs: ?Potassium (mmol/L)  ?Date Value  ?05/03/2021 4.6  ?01/30/2013 3.6  ? ?Magnesium (mg/dL)  ?Date Value  ?05/03/2021 2.3  ? ?Calcium (mg/dL)  ?Date Value  ?05/03/2021 7.4 (L)  ? ?Calcium, Total (mg/dL)  ?Date Value  ?01/30/2013 8.3 (L)  ? ?Albumin (g/dL)  ?Date Value  ?05/01/2021 2.1 (L)  ?01/28/2013 3.6  ? ?Phosphorus (mg/dL)  ?Date Value  ?05/03/2021 5.6 (H)  ? ?Sodium (mmol/L)  ?Date Value  ?05/03/2021 139  ?01/30/2013 134 (L)  ? ? ? ?Assessment: ?88yo M w/ h/o severe PAD w/ bilateral wounds/foot ulcerations, alzheimer's dementia, chronic anemia & worsened chronic thrombocytopenia admitted for septic shock 2/2 CAP. Pharmacy consulted for electrolyte mgmt. ? ?Ca 7.4,   Ca (corrected): 8.9 mg/dL (alb 2.1) ? ?Goal of Therapy:  ?Lytes WNL ? ?Plan:  ?Currently lytes are not requiring repletion, but will watch closely ISO AKI Scr 2.97 (03/2020 was 1.24). ?Will CTM w/ AM labs daily and replace PRN. ? ?Angelique Blonder ,PharmD ?Clinical Pharmacist ?05/03/2021 8:47 AM ? ?

## 2021-05-03 NOTE — Progress Notes (Signed)
Spoke with Jeffrey Nielsen today. I updated him on his stepfather's clinical status and prognosis. We discussed marginal improvement in hemodynamics and respiratory status, but no change in mentation. He is not able to swallow and we have been unable to pass NG tubes after multiple attempts, with both a Dobhoff and small-bore nasogastric tube. We discussed the options for feeding which are more invasive than Jeffrey Nielsen finds acceptable. We then discussed quality of life and expectations for best case recovery still including significant functional decline and decrease in quality of life. Jeffrey Nielsen voiced understanding of that information. He would like to discuss possible options for Hospice care and would like to make a decision on possible transfer to inpatient hospice if there is no significant improvement in his stepdad's clinic status within the next 1-2 days. ? ?- Will reach out to Hospice services to discuss options with Jeffrey Nielsen ?-   Code Status: DNR  ?

## 2021-05-03 NOTE — Progress Notes (Signed)
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 ?Received request to speak with the son of Jeffrey Nielsen deceased wife Jeffrey Nielsen. Jeffrey Nielsen is the only relative that is in touch with Jeffrey Nielsen and is his Management consultant. ? ?Explained hospice services, specifically related to Hospice Home. ? ?Jeffrey Nielsen is trying to honor the known wishes of Jeffrey Nielsen and does not want to escalate care further but also does not want to hasten his demise. He would like to wait a day or so to see how Jeffrey Nielsen responds to the current treatment modality before he makes a decision about end of life care. ? ?Jeffrey Nielsen was agreeable to hospice engaging again on Monday ~ 2 pm once he is done with obligations he has in the morning. ? ?[facility] will continue to follow and help with d/c planning. ? ?Wallis Bamberg BSN, RN ?[facility] Liaison  ?

## 2021-05-03 NOTE — TOC Initial Note (Signed)
Transition of Care (TOC) - Initial/Assessment Note  ? ? ?Patient Details  ?Name: Jeffrey Nielsen ?MRN: [id number] ?Date of Birth: 1926/03/12 ? ?Transition of Care (TOC) CM/SW Contact:    ?Adelene Amas, LCSW ?Phone Number: ?05/03/2021, 2:37 PM ? ?Clinical Narrative:                 ? ?Patient from [facility] w/ dx of pneumonia. CSW received call from Attending requesting hospice consult.  CSW contacted [facility] hospice on-call RN Janean Sark, who stated she would contact [place] on-call RN. Hospice RN stated she would contact the patient's son. CSW updated patient's son, Jeffrey Nielsen [phone] [facility] Phone). CSW updated Attending. Attending stated he made palliative care consult as well. ?  ?  ? ? ?Patient Goals and CMS Choice ?  ?  ?  ? ?Expected Discharge Plan and Services ?  ?  ?  ?  ?  ?                ?  ?  ?  ?  ?  ?  ?  ?  ?  ?  ? ?Prior Living Arrangements/Services ?  ?  ?  ?       ?  ?  ?  ?  ? ?Activities of Daily Living ?  ?  ? ?Permission Sought/Granted ?  ?  ?   ?   ?   ?   ? ?Emotional Assessment ?  ?  ?  ?  ?  ?  ? ?Admission diagnosis:  Thrombocytopenia ([place]) [D69.6] ?Sepsis ([place]) [A41.9] ?Altered mental status, unspecified altered mental status type [R41.82] ?Community acquired pneumonia, unspecified laterality [J18.9] ?Sepsis with acute organ dysfunction and septic shock, due to unspecified organism, unspecified type ([place]) [A41.9, R65.21] ?Patient Active Problem List  ? Diagnosis Date Noted  ? Multifocal pneumonia 05/02/2021  ? Lactic acidosis 05/02/2021  ? Thrombocytopenia ([place]) 05/02/2021  ? Acute respiratory failure with hypoxia ([place]) 05/02/2021  ? Septic shock ([place]) 05/01/2021  ? Pressure ulcer 04/12/2021  ? Atherosclerosis of native arteries of the extremities with ulceration ([place]) 04/06/2021  ? Age-related physical debility 05/07/2020  ? Immobility 05/07/2020  ? Elevated troponin 03/30/2020  ? Syncope and collapse 03/30/2020  ? Alzheimer's dementia ([place]) 03/29/2020  ? Hypertension  03/29/2020  ? Hypotension 03/29/2020  ? Recurrent syncope 03/29/2020  ? Pancytopenia ([place]) 03/29/2020  ? AKI (acute kidney injury) ([place]) 03/29/2020  ? ?PCP:  Alvester Morin, MD ?Pharmacy:   ?[facility] [address] ?[place] [address] ?Phone: [phone] Fax: [phone] ? ? ? ? ?Social Determinants of Health (SDOH) Interventions ?  ? ?Readmission Risk Interventions ?   ? View : No data to display.  ?  ?  ?  ? ? ? ?

## 2021-05-03 NOTE — Progress Notes (Signed)
? ?NAME:  Jeffrey Nielsen, MRN:  [id number], DOB:  September 11, 1926, LOS: 2 ?ADMISSION DATE:  05/01/2021, CONSULTATION DATE: 05/01/2021 ?REFERRING MD: Dr. Cherylann Banas, CHIEF COMPLAINT: Pneumonia  ? ?History of Present Illness:  ?This is a 88 yo male who presented to [facility] ER via EMS from [facility] with confirmed pneumonia dx via CXR a few days ago.  Rocephin x2 doses initiated at facility, however symptoms worsened with O2 sats in the low 80's on RA prompting ER visit.  HPI obtained from chart pt unable to participate due to severe respiratory distress along with acute encephalopathy. ? ?ED Course ?Upon arrival to the ER pt significantly hypothermic temp 89.3 degrees F rectally, bradycardic hr 49-50's, and hypotensive map 54-60's.  Pt also hypoxic requiring 10L/min via NRB.  COVID-19/Influenza A&B by PCR negative, however CXR revealed widespread pneumonia along with possible pulmonary edema.  Lab results revealed CO2 19, BUN 89, creatinine 2.84, alk phos 236, albumin 2.1, lactic acid 4.4, hgb 8.6, platelets 17, and PT 19.9/INR 1.7.  UA with small leukocytes/rare bacteria/>50 rbc/>50 wbc.  Sepsis protocol initiated pt received 2L LR bolus, cefepime, and vancomycin.  Due to continued hypotension levophed gtt initiated.  PCCM team contacted for ICU admission.  ? 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 ?Significant Hospital Events: ?Including procedures, antibiotic start and stop dates in addition to other pertinent events   ?04/7: Pt admitted to ICU with acute kidney injury, severe sepsis, and acute hypoxic respiratory failure secondary to widespread pneumonia requiring levophed gtt  ?4/8: remains on peripheral pressors, body temperature normalized on Bair hugger, on HHFNC ?4/9: pressor requirement down; unable to pass NGT after multiple attempts ? ?Interim History / Subjective:   ?Remains on HHFNC at minimal settings. Unable to pass NG after multiple attempts. Renal function remains stable, UOP ~30 cc/kg/h last 24 hours. 1/4 blood cx positive for Staph spp, probable contaminant but repeated blood cxs. ? ?Objective   ?Blood pressure (!) 108/54, pulse 73, temperature 98.1 ?F (36.7 ?C), temperature source Bladder, resp. rate 17, height _0  (1.93 m), weight 108 kg, SpO2 99 %. ?   ?FiO2 (%):  [35 %-40 %] 35 %  ? ?Intake/Output Summary (Last 24 hours) at 05/03/2021 0928 ?Last data filed at 05/03/2021 0503 ?Gross per 24 hour  ?Intake 1142.43 ml  ?Output 785 ml  ?Net 357.43 ml  ? ?Filed Weights  ? 05/01/21 1505 05/01/21 2000 05/02/21 0500  ?Weight: 67.8 kg 108 kg 108 kg  ? ? ?Examination: ?General: acute on chronically ill appearing elderly male ?Lungs: scant crackles and expiratory rhonchi bilaterally ?Cardiovascular: RRR, s1s2, no m/r/g ?Abdomen: +BS x4, obese, soft, non tender, non distended  ?Extremities: right heel ulceration along with bilateral lower extremity vascular changes. +bilateral lower extremity pitting edema, 2+ bilateral upper extremity edema  ?Neuro: awake, not oriented ?GU: Indwelling foley catheter draining tea colored urine  ? ?Resolved Hospital Problem list   ?Lactic acidosis, resolved ?Hypothermia ? ?Assessment & Plan:  ?Acute hypoxic respiratory failure secondary to multifocal pneumonia  ?- Supplemental O2 for dyspnea and/or hypoxia  ?- Prn bronchodilator therapy  ?- Trend WBC and monitor fever curve  ?- Trend PCT  ?- Follow blood and sputum cultures ?- Continue vancomycin and cefepime for now; deescalate pending cultures ?- Chest PT q4 ? ?Septic shock ?- Continuous telemetry monitoring  ?- Peripheral levophed gtt to maintain map >60 ?- Continue stress dose steroids  as patient on Florinef at baseline ?- Trend lactate to close; gentle volume resuscitation ? ?Acute kidney injury secondary to ATN in setting sepsis ?Metabolic acidosis ?- Trend BMP ?- Replace electrolytes as  indicated  ?- Monitor UOP ?- Avoid nephrotoxic medications ?- D5-sodium bicarb infusion ? ?Acute on chronic thrombocytopenia in setting sepsis  ?No schistocytes on tech review of peripheral smear. ?- Trend CBC ?- Monitor for s/sx of bleeding and transfuse platelets and/or pRBC's for active signs of bleeding ? 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 ?Acute toxic encephalopathy  ?- Supportive care  ?- Frequent reorientation  ?- Resume home Depakote, Aricept if able to get enteral access ? ?Hypothyroidism ?- Continue home Synthroid when able ? ?Best Practice (right click and "Reselect all SmartList Selections" daily)  ? ?Diet/type: NPO; will discuss NG with stepson, it may need to be placed under fluoroscopic guidance ?DVT prophylaxis: SCD ?GI prophylaxis: PPI ?Lines: N/A ?Foley:  Yes, and it is still needed ?Code Status:  DNR ?Last date of multidisciplinary goals of care discussion [4/8] ? ?Discussed plan of care with pts step-son and proxy [name] at bedside he confirmed pt is DNR/DNI.  He states he would like to continue current plan of care as outlined above, however he does want to keep the pt comfortable.  Discussed if pt declines and does not respond to treatment will need to consider transitioning to Comfort Measures Only. ?Labs   ?CBC: ?Recent Labs  ?Lab 05/01/21 ?1511 05/02/21 ?7939 05/03/21 ?0300  ?WBC 6.8 11.9* 9.5  ?HGB 8.6* 9.1* 8.2*  ?HCT 26.4* 26.4* 23.8*  ?MCV 109.1* 105.2* 105.3*  ?PLT 17* 38* 30*  ? ? ?Basic Metabolic Panel: ?Recent Labs  ?Lab 05/01/21 ?1511 05/02/21 ?9233 05/03/21 ?0076  ?NA 140 137 139  ?K 4.4 4.7 4.6  ?CL 108 106 109  ?CO2 19* 18* 19*  ?GLUCOSE 88 84 130*  ?BUN 89* 89* 93*  ?CREATININE 2.84* 2.97* 2.97*  ?CALCIUM 7.7* 7.4* 7.4*  ?MG  --  2.2 2.3  ?PHOS  --  5.7* 5.6*  ? ?GFR: ?Estimated Creatinine Clearance: 21.4 mL/min (A) (by C-G formula based  on SCr of 2.97 mg/dL (H)). ?Recent Labs  ?Lab 05/01/21 ?1511 05/01/21 ?1532 05/01/21 ?2014 05/02/21 ?2263 05/02/21 ?3354 05/03/21 ?5625 05/03/21 ?0730  ?PROCALCITON  --   --  1.41 1.58  --  1.55  --   ?WBC 6.8  --   --  11.9*  --  9.5  --   ?LATICACIDVEN  --    < > 4.6* 2.4* 2.3*  --  1.9  ? < > = values in this interval not displayed.  ? ? ?Liver Function Tests: ?Recent Labs  ?Lab 05/01/21 ?1511  ?AST 41  ?ALT 38  ?ALKPHOS 236*  ?BILITOT 1.2  ?PROT 6.2*  ?ALBUMIN 2.1*  ? ?No results for input(s): LIPASE, AMYLASE in the last 168 hours. ?No results for input(s): AMMONIA in the last 168 hours. ? ?ABG ?   ?Component Value Date/Time  ? HCO3 20.0 05/01/2021 1540  ? ACIDBASEDEF 5.4 (H) 05/01/2021 1540  ? O2SAT 69.7 05/01/2021 1540  ?  ? ?Coagulation Profile: ?Recent Labs  ?Lab 05/01/21 ?1511  ?INR 1.7*  ? ? ?Cardiac Enzymes: ?No results for input(s): CKTOTAL, CKMB, CKMBINDEX, TROPONINI in the last 168 hours. ? ?HbA1C: ?No results found for: HGBA1C ? ?CBG: ?Recent Labs  ?Lab 05/02/21 ?1537 05/02/21 ?1935 05/02/21 ?2327 05/03/21 ?  9847 05/03/21 ?0750  ?GLUCAP 104* 123* 119* 126* 120*  ? ? ?Review of Systems:   ?Unable to assess due to acute encephalopathy  ? ?Past Medical History:  ?He,  has a past medical history of HTN (hypertension) and Hypothyroidism.  ? ?Surgical History:  ? ?Past Surgical History:  ?Procedure Laterality Date  ? CERVICAL DISC SURGERY    ?  ? ?Social History:  ? reports that he has never smoked. He has never used smokeless tobacco. He reports that he does not currently use alcohol. He reports that he does not use drugs.  ? ?Family History:  ?His family history is not on file.  ? ?Allergies ?No Known Allergies  ? ?Home Medications  ?Prior to Admission medications   ?Medication Sig Start Date End Date Taking? Authorizing Provider  ?ALOE VESTA CLEAR ANTIFUNGAL 2 % OINT Apply 1 application. topically 3 (three) times daily as needed (nail fungus).   Yes [provider]  ?Amino Acids-Protein Hydrolys  (PRO-STAT AWC) LIQD Take 30 mLs by mouth daily.   Yes [provider]  ?cefTRIAXone (ROCEPHIN) IVPB Inject 1 g into the vein daily. 04/30/21 05/06/21 Yes [provider]  ?divalproex (DEPAKOTE)

## 2021-05-03 NOTE — Consult Note (Signed)
Pharmacy Antibiotic Note ? ?Jeffrey Nielsen is a 88 y.o. male w/ h/o severe PAD w/ bilateral wounds/foot ulcerations, alzheimer's dementia, chronic anemia & worsened chronic thrombocytopenia admitted on 05/01/2021 for septic shock 2/2 HCAP.  Pharmacy has been consulted for Vancomycin & cefepime dosing. ? ?-BCID 4/8: 1/4 bottles (aerobic). GPC, Staph species detected ? ?Plan: ?cefepime 2g IV q24h   Crcl 21.4 ml/min ?Vancomycin loading dose of 2.5g (1gm+1.5gm) on 05/01/21  ?(Current renal fxn gives dosing interval of 36-48hrs ISO AKI. Will hold off scheduling doses to see if renal fxn recovers or if dosing by levels is more appropriate.) ? ? ?4/9  random Vancomycin level @0538 = 18 mcg/ml (~36hrs post initial dose).   ?Scr 2.84>>2.97>>2.97 has not improved ?Will order Vancomycin 1 gram IV x 1 dose. ?Check random Vanc level in 24 hrs to assess further dosing. F/u Scr ? ? ? ?Height: 6\' 4"  (193 cm) ?Weight: 108 kg (238 lb 1.6 oz) ?IBW/kg (Calculated) : 86.8 ? ?Temp (24hrs), Avg:97.9 ?F (36.6 ?C), Min:96.1 ?F (35.6 ?C), Max:99 ?F (37.2 ?C) ? ?Recent Labs  ?Lab 05/01/21 ?1511 05/01/21 ?1532 05/01/21 ?2014 05/02/21 ?07/01/21 05/02/21 ?0488 05/03/21 ?8916 05/03/21 ?0730  ?WBC 6.8  --   --  11.9*  --  9.5  --   ?CREATININE 2.84*  --   --  2.97*  --  2.97*  --   ?LATICACIDVEN  --  4.4* 4.6* 2.4* 2.3*  --  1.9  ?VANCORANDOM  --   --   --   --   --  18  --   ? ?  ?Estimated Creatinine Clearance: 21.4 mL/min (A) (by C-G formula based on SCr of 2.97 mg/dL (H)).   ? ?No Known Allergies ? ?Antimicrobials this admission: ?Vancomycin & cefepime (4/07 >>  ? ?Dose adjustments this admission: ?CTM closely ISO AKI. Scr 2.97(baseline of 1.24 in 03/2020) ? ?Microbiology results: ?4/07 BCx:1of 4+ GPC ?4/07 UCx: NG ?4/07 Flu/Cov: negative  ?4/07 MRSA PCR: ordered ? ?Thank you for allowing pharmacy to be a part of this patient?s care. ? ?Trae Bovenzi A ?05/03/2021 8:51 AM ? ?

## 2021-05-04 ENCOUNTER — Ambulatory Visit: Admission: RE | Admit: 2021-05-04 | Payer: Medicare Other | Source: Home / Self Care | Admitting: Vascular Surgery

## 2021-05-04 ENCOUNTER — Encounter
Admission: EM | Disposition: A | Discharge status: Hospice | Payer: Self-pay | Source: Home / Self Care | Attending: Internal Medicine

## 2021-05-04 DIAGNOSIS — I70299 Other atherosclerosis of native arteries of extremities, unspecified extremity: Secondary | ICD-10-CM

## 2021-05-04 DIAGNOSIS — J189 Pneumonia, unspecified organism: Secondary | ICD-10-CM

## 2021-05-04 DIAGNOSIS — L97909 Non-pressure chronic ulcer of unspecified part of unspecified lower leg with unspecified severity: Secondary | ICD-10-CM

## 2021-05-04 DIAGNOSIS — R6521 Severe sepsis with septic shock: Secondary | ICD-10-CM

## 2021-05-04 DIAGNOSIS — A419 Sepsis, unspecified organism: Secondary | ICD-10-CM | POA: Diagnosis not present

## 2021-05-04 LAB — PHOSPHORUS: Phosphorus: 5.3 mg/dL — ABNORMAL HIGH (ref 2.5–4.6)

## 2021-05-04 LAB — BASIC METABOLIC PANEL
Anion gap: 11 (ref 5–15)
BUN: 81 mg/dL — ABNORMAL HIGH (ref 8–23)
CO2: 23 mmol/L (ref 22–32)
Calcium: 7.4 mg/dL — ABNORMAL LOW (ref 8.9–10.3)
Chloride: 108 mmol/L (ref 98–111)
Creatinine, Ser: 2.56 mg/dL — ABNORMAL HIGH (ref 0.61–1.24)
GFR, Estimated: 23 mL/min — ABNORMAL LOW (ref >60)
Glucose, Bld: 111 mg/dL — ABNORMAL HIGH (ref 70–99)
Potassium: 3.8 mmol/L (ref 3.5–5.1)
Sodium: 142 mmol/L (ref 135–145)

## 2021-05-04 LAB — CBC
HCT: 22.7 % — ABNORMAL LOW (ref 39.0–52.0)
Hemoglobin: 7.6 g/dL — ABNORMAL LOW (ref 13.0–17.0)
MCH: 35.7 pg — ABNORMAL HIGH (ref 26.0–34.0)
MCHC: 33.5 g/dL (ref 30.0–36.0)
MCV: 106.6 fL — ABNORMAL HIGH (ref 80.0–100.0)
Platelets: 20 10*3/uL — CL (ref 150–400)
RBC: 2.13 MIL/uL — ABNORMAL LOW (ref 4.22–5.81)
RDW: 17.4 % — ABNORMAL HIGH (ref 11.5–15.5)
WBC: 7.4 10*3/uL (ref 4.0–10.5)
nRBC: 0.4 % — ABNORMAL HIGH (ref 0.0–0.2)

## 2021-05-04 LAB — PATHOLOGIST SMEAR REVIEW

## 2021-05-04 LAB — GLUCOSE, CAPILLARY
Glucose-Capillary: 106 mg/dL — ABNORMAL HIGH (ref 70–99)
Glucose-Capillary: 108 mg/dL — ABNORMAL HIGH (ref 70–99)
Glucose-Capillary: 101 mg/dL — ABNORMAL HIGH (ref 70–99)

## 2021-05-04 LAB — LEGIONELLA PNEUMOPHILA SEROGP 1 UR AG: L. pneumophila Serogp 1 Ur Ag: NEGATIVE

## 2021-05-04 LAB — MAGNESIUM: Magnesium: 2.4 mg/dL (ref 1.7–2.4)

## 2021-05-04 LAB — VANCOMYCIN, RANDOM: Vancomycin Rm: 24

## 2021-05-04 SURGERY — LOWER EXTREMITY ANGIOGRAPHY
Anesthesia: Moderate Sedation | Site: Leg Lower | Laterality: Right

## 2021-05-04 MED ORDER — MORPHINE SULFATE (PF) 2 MG/ML IV SOLN
2.0000 mg | INTRAVENOUS | Status: DC | PRN
  Administered 2021-05-05 (×4): 2 mg via INTRAVENOUS
  Filled 2021-05-04 (×4): qty 1

## 2021-05-04 MED ORDER — SODIUM CHLORIDE 0.9 % IV SOLN
3.0000 g | Freq: Two times a day (BID) | INTRAVENOUS | Status: DC
  Filled 2021-05-04: qty 8

## 2021-05-04 MED ORDER — LEVOTHYROXINE SODIUM 100 MCG/5ML IV SOLN
75.0000 ug | Freq: Every day | INTRAVENOUS | Status: DC
Start: 2021-05-05 — End: 2021-05-04

## 2021-05-04 MED ORDER — GLYCOPYRROLATE 0.2 MG/ML IJ SOLN
0.4000 mg | Freq: Three times a day (TID) | INTRAMUSCULAR | Status: DC
  Administered 2021-05-04 – 2021-05-05 (×4): 0.4 mg via INTRAVENOUS
  Filled 2021-05-04 (×4): qty 2

## 2021-05-04 MED ORDER — ACETAMINOPHEN 325 MG PO TABS: 650.0000 mg | ORAL_TABLET | Freq: Four times a day (QID) | ORAL | Status: DC | PRN

## 2021-05-04 MED ORDER — LORAZEPAM 2 MG/ML IJ SOLN: 1.0000 mg | INTRAMUSCULAR | Status: DC | PRN

## 2021-05-04 NOTE — Consult Note (Signed)
PHARMACY CONSULT NOTE - FOLLOW UP ? ?Pharmacy Consult for Electrolyte Monitoring and Replacement  ? ?Recent Labs: ?Potassium (mmol/L)  ?Date Value  ?05/04/2021 3.8  ?01/30/2013 3.6  ? ?Magnesium (mg/dL)  ?Date Value  ?05/04/2021 2.4  ? ?Calcium (mg/dL)  ?Date Value  ?05/04/2021 7.4 (L)  ? ?Calcium, Total (mg/dL)  ?Date Value  ?01/30/2013 8.3 (L)  ? ?Albumin (g/dL)  ?Date Value  ?05/01/2021 2.1 (L)  ?01/28/2013 3.6  ? ?Phosphorus (mg/dL)  ?Date Value  ?05/04/2021 5.3 (H)  ? ?Sodium (mmol/L)  ?Date Value  ?05/04/2021 142  ?01/30/2013 134 (L)  ? ?Corrected calcium: 8.92 mg/dL (albumin 2.1) ?Scr 2.97 > 2.56 ? ?Assessment: ?88yo M w/ h/o severe PAD w/ bilateral wounds/foot ulcerations, alzheimer's dementia, chronic anemia & worsened chronic thrombocytopenia admitted for septic shock 2/2 CAP. Pharmacy consulted for electrolyte mgmt. ? ?Goal of Therapy:  ?Lytes WNL ? ?Plan:  ?No replacement warranted at this time ?Follow up AM labs ? ?Jaynie Bream, PharmD ?Pharmacy Resident  ?05/04/2021 ?8:34 AM ? ? ?

## 2021-05-04 NOTE — Progress Notes (Addendum)
0800 Patient awake and following very limited commands. Cough is forceful but no sputum retrieved. Skin flakey and dry on lower extremities. Right foot swollen 4+ mand breakdown noted. Skin sloughs off with dressing change. Right heel unstageable breakdown. Main area of right heel black in color. Patient does not show pain when area touched with dressing. Left lateral ankle noted to also have un stageable area the size of a silver dollar. No bone is showing but area is black in the center. Patient attempts to move feet when touched but no grimacing or change in facial expressions noted. Bruising of arms noted bilaterally. Patient able to touch face with right arm but will not follow commands. No speech or groaning noted. Does not like mouth care. Mouth open constantly. No SCDS placed due to swelling in lower extremities. Virgia Land NP from Hospice in to talk with step son Ree Kida. Patient changed to comfort care per stepson. Will be transferred to Hospice home when a bed available. Mouth care and turning every 2 hours. Patient appears not to be in pain. Coughs intermittently. Although turned to back remains on left side. ?

## 2021-05-04 NOTE — Progress Notes (Signed)
? ?NAME:  Jeffrey Nielsen, MRN:  [id number], DOB:  28-Mar-1926, LOS: 3 ?ADMISSION DATE:  05/01/2021, CONSULTATION DATE: 05/01/2021 ?REFERRING MD: Dr. Cherylann Banas, CHIEF COMPLAINT: Pneumonia  ? ?History of Present Illness:  ?This is a 88 yo male who presented to [facility] ER via EMS from [facility] with confirmed pneumonia dx via CXR a few days ago.  Rocephin x2 doses initiated at facility, however symptoms worsened with O2 sats in the low 80's on RA prompting ER visit.  HPI obtained from chart pt unable to participate due to severe respiratory distress along with acute encephalopathy. ? ?ED Course ?Upon arrival to the ER pt significantly hypothermic temp 89.3 degrees F rectally, bradycardic hr 49-50's, and hypotensive map 54-60's.  Pt also hypoxic requiring 10L/min via NRB.  COVID-19/Influenza A&B by PCR negative, however CXR revealed widespread pneumonia along with possible pulmonary edema.  Lab results revealed CO2 19, BUN 89, creatinine 2.84, alk phos 236, albumin 2.1, lactic acid 4.4, hgb 8.6, platelets 17, and PT 19.9/INR 1.7.  UA with small leukocytes/rare bacteria/>50 rbc/>50 wbc.  Sepsis protocol initiated pt received 2L LR bolus, cefepime, and vancomycin.  Due to continued hypotension levophed gtt initiated.  PCCM team contacted for ICU admission.  ? ?05/04/21- patient is critically ill with high risk of death due to chronic comorbid status with advanced age.  He is being evaluated by wound care and palliative.  ? 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 ?Significant Hospital Events: ?Including procedures, antibiotic start and stop dates in addition to other pertinent events   ?04/7: Pt admitted to ICU with acute kidney injury, severe sepsis, and acute hypoxic respiratory failure secondary to widespread pneumonia requiring levophed gtt  ?4/8: remains on peripheral pressors,  body temperature normalized on Bair hugger, on HHFNC ?4/9: pressor requirement down; unable to pass NGT after multiple attempts ? ?Interim History / Subjective:  ?Remains on HHFNC at minimal settings. Unable to pass NG after multiple attempts. Renal function remains stable, UOP ~30 cc/kg/h last 24 hours. 1/4 blood cx positive for Staph spp, probable contaminant but repeated blood cxs. ? ?Objective   ?Blood pressure (!) 105/56, pulse 72, temperature 97.7 ?F (36.5 ?C), resp. rate (!) 22, height '6\' 4"'  (1.93 m), weight 108 kg, SpO2 92 %. ?   ?FiO2 (%):  [35 %-37 %] 37 %  ? ?Intake/Output Summary (Last 24 hours) at 05/04/2021 0829 ?Last data filed at 05/04/2021 0200 ?Gross per 24 hour  ?Intake 1455.35 ml  ?Output 400 ml  ?Net 1055.35 ml  ? ? ?Filed Weights  ? 05/01/21 1505 05/01/21 2000 05/02/21 0500  ?Weight: 67.8 kg 108 kg 108 kg  ? ? ?Examination: ?General: acute on chronically ill appearing elderly male ?Lungs: scant crackles and expiratory rhonchi bilaterally ?Cardiovascular: RRR, s1s2, no m/r/g ?Abdomen: +BS x4, obese, soft, non tender, non distended  ?Extremities: right heel ulceration along with bilateral lower extremity vascular changes. +bilateral lower extremity pitting edema, 2+ bilateral upper extremity edema  ?Neuro: awake, not oriented ?GU: Indwelling foley catheter draining tea colored urine  ? ?Resolved Hospital Problem list   ?Lactic acidosis, resolved ?Hypothermia ? ?Assessment & Plan:  ?Acute hypoxic respiratory failure secondary to multifocal pneumonia  ?- Supplemental O2 for dyspnea and/or hypoxia  ?- Prn bronchodilator therapy  ?- Trend WBC and monitor fever curve  ?- Trend PCT  ?- Follow blood and sputum cultures ?- Continue vancomycin and cefepime for  now; deescalate pending cultures ?- Chest PT q4 ? ?Septic shock ?- Continuous telemetry monitoring  ?- Peripheral levophed gtt to maintain map >60 ?- Continue stress dose steroids as patient on Florinef at baseline ?- Trend lactate to close; gentle  volume resuscitation ? ?Acute kidney injury secondary to ATN in setting sepsis ?Metabolic acidosis ?- Trend BMP ?- Replace electrolytes as indicated  ?- Monitor UOP ?- Avoid nephrotoxic medications ?- D5-sodium bicarb infusion ? ?Acute on chronic thrombocytopenia in setting sepsis  ?No schistocytes on tech review of peripheral smear. ?- Trend CBC ?- Monitor for s/sx of bleeding and transfuse platelets and/or pRBC's for active signs of bleeding ? 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 ?Acute toxic encephalopathy  ?- Supportive care  ?- Frequent reorientation  ?- Resume home Depakote, Aricept if able to get enteral access ? ?Hypothyroidism ?- Continue home Synthroid when able ? ?Best Practice (right click and "Reselect all SmartList Selections" daily)  ? ?Diet/type: NPO; will discuss NG with stepson, it may need to be placed under fluoroscopic guidance ?DVT prophylaxis: SCD ?GI prophylaxis: PPI ?Lines: N/A ?Foley:  Yes, and it is still needed ?Code Status:  DNR ?Last date of multidisciplinary goals of care discussion [4/8] ? ?Discussed plan of care with pts step-son and proxy [name] at bedside he confirmed pt is DNR/DNI.  He states he would like to continue current plan of care as outlined above, however he does want to keep the pt comfortable.  Discussed if pt declines and does not respond to treatment will need to consider transitioning to Comfort Measures Only. ?Labs   ?CBC: ?Recent Labs  ?Lab 05/01/21 ?1511 05/02/21 ?1017 05/03/21 ?5102 05/04/21 ?5852  ?WBC 6.8 11.9* 9.5 7.4  ?HGB 8.6* 9.1* 8.2* 7.6*  ?HCT 26.4* 26.4* 23.8* 22.7*  ?MCV 109.1* 105.2* 105.3* 106.6*  ?PLT 17* 38* 30* 20*  ? ? ? ?Basic Metabolic Panel: ?Recent Labs  ?Lab 05/01/21 ?1511 05/02/21 ?7782 05/03/21 ?4235 05/04/21 ?3614  ?NA 140 137 139 142  ?K 4.4 4.7 4.6 3.8  ?CL 108 106 109 108  ?CO2 19* 18* 19* 23   ?GLUCOSE 88 84 130* 111*  ?BUN 89* 89* 93* 81*  ?CREATININE 2.84* 2.97* 2.97* 2.56*  ?CALCIUM 7.7* 7.4* 7.4* 7.4*  ?MG  --  2.2 2.3 2.4  ?PHOS  --  5.7* 5.6* 5.3*  ? ? ?GFR: ?Estimated Creatinine Clearance: 24.8 mL/min (A) (by C-G formula based on SCr of 2.56 mg/dL (H)). ?Recent Labs  ?Lab 05/01/21 ?1511 05/01/21 ?1532 05/01/21 ?2014 05/02/21 ?4315 05/02/21 ?4008 05/03/21 ?6761 05/03/21 ?0730 05/04/21 ?9509  ?PROCALCITON  --   --  1.41 1.58  --  1.55  --   --   ?WBC 6.8  --   --  11.9*  --  9.5  --  7.4  ?LATICACIDVEN  --    < > 4.6* 2.4* 2.3*  --  1.9  --   ? < > = values in this interval not displayed.  ? ? ? ?Liver Function Tests: ?Recent Labs  ?Lab 05/01/21 ?1511  ?AST 41  ?ALT 38  ?ALKPHOS 236*  ?BILITOT 1.2  ?PROT 6.2*  ?ALBUMIN 2.1*  ? ? ?No results for input(s): LIPASE, AMYLASE in the last 168 hours. ?No results for input(s): AMMONIA in the last 168 hours. ? ?ABG ?   ?Component Value Date/Time  ? HCO3 20.0 05/01/2021 1540  ? ACIDBASEDEF 5.4 (H)  05/01/2021 1540  ? O2SAT 69.7 05/01/2021 1540  ? ?  ? ?Coagulation Profile: ?Recent Labs  ?Lab 05/01/21 ?1511  ?INR 1.7*  ? ? ? ?Cardiac Enzymes: ?No results for input(s): CKTOTAL, CKMB, CKMBINDEX, TROPONINI in the last 168 hours. ? ?HbA1C: ?No results found for: HGBA1C ? ?CBG: ?Recent Labs  ?Lab 05/03/21 ?1552 05/03/21 ?2018 05/03/21 ?5974 05/04/21 ?1638 05/04/21 ?4536  ?GLUCAP 135* 138* 137* 106* 108*  ? ? ? ?Review of Systems:   ?Unable to assess due to acute encephalopathy  ? ?Past Medical History:  ?He,  has a past medical history of HTN (hypertension) and Hypothyroidism.  ? ?Surgical History:  ? ?Past Surgical History:  ?Procedure Laterality Date  ? CERVICAL DISC SURGERY    ?  ? ?Social History:  ? reports that he has never smoked. He has never used smokeless tobacco. He reports that he does not currently use alcohol. He reports that he does not use drugs.  ? ?Family History:  ?His family history is not on file.  ? ?Allergies ?No Known Allergies  ? ?Home  Medications  ?Prior to Admission medications   ?Medication Sig Start Date End Date Taking? Authorizing Provider  ?ALOE VESTA CLEAR ANTIFUNGAL 2 % OINT Apply 1 application. topically 3 (three) times daily as nee

## 2021-05-04 NOTE — Consult Note (Signed)
? ?                                                                                ?Consultation Note ?Date: 05/04/2021  ? ?Patient Name: Jeffrey Nielsen  ?DOB: 08-29-1926  MRN: [id number]  Age / Sex: 88 y.o., male  ?PCP: Keane Police, MD ?Referring Physician: Vida Rigger, MD ? ?Reason for Consultation: Establishing goals of care and Psychosocial/spiritual support ? ?HPI/Patient Profile: 88 y.o. male  admitted from [facility] on 05/01/2021 with  confirmed pneumonia dx via CXR a few days ago.  Rocephin x2 doses initiated at facility, however symptoms worsened with O2 sats in the low 80's on RA prompting ER visit.  ? ?Patient with continued physical, functional and cognitive decline over the past many months.  He has been a resident of a skilled nursing facility since 2014.  He has significant severe atherosclerotic changes in both lower extremities associated with ulceration and tissue loss of the right foot.  Per vascular surgery right lower extremity with limb threatening ischemia ? ?Family face treatment option decisions advanced directive decisions and anticipatory care needs  ? ? ?Clinical Assessment and Goals of Care: ? ? ? ?This NP Lorinda Creed reviewed medical records, received report from team, assessed the patient and then meet at the patient's bedsides along with his step-son/Jeffrey Nielsen  to discuss diagnosis, prognosis, GOC, EOL wishes disposition and options. ?  ?Concept of Palliative Care was introduced as specialized medical care for people and their families living with serious illness.  If focuses on providing relief from the symptoms and stress of a serious illness.  The goal is to improve quality of life for both the patient and the family. ? ?Values and goals of care important to patient and family were attempted to be elicited. ? ?Education offered on the concept specific to adult failure to thrive and the limitations of medical interventions to prolong quality of life when the  body fails to thrive.  Patient's high risk for continued decompensation in spite of current medical interventions. ?  ?A  discussion was had today regarding advanced directives.  Concepts specific to code status, artifical feeding and hydration, continued IV antibiotics and rehospitalization was had.   ? ?The difference between a aggressive medical intervention path  and a palliative comfort care path for this patient at this time was had.     ? ?Education offered on hospice benefit; philosophy and eligibility specific to residential hospice. ? ?Family verbalize an understanding of the associated poor prognosis and decision is made to shift to full comfort hoping for comfort and dignity at end-of-life. ? ? ?MOST form completed ?  ?Natural trajectory and expectations at EOL were discussed.  Questions and concerns addressed.  Patient  encouraged to call with questions or concerns.   ?  ?PMT will continue to support holistically. ?  ?  ?  ?  ? ? ? Ree Kida Nielsen/step-son/POA ? ?SUMMARY OF RECOMMENDATIONS   ? ?Code Status/Advance Care Planning: ?DNR ?Focus of care as of today is comfort, quality and dignity at end-of-life. ? ? ?Symptom Management:  ?Dyspnea/pain: Morphine 2 mg IV every 1 hour as needed ?Agitation: Ativan 1 mg IV  every 4 hours as needed ?Terminal secretions: Robinul 0.4 mg IV 3 times a day ? ?Palliative Prophylaxis:  ?Aspiration, Bowel Regimen, Frequent Pain Assessment, and Oral Care ? ?Additional Recommendations (Limitations, Scope, Preferences): ?Full Comfort Care ? ?Psycho-social/Spiritual:  ?Desire for further Chaplaincy support:no ?Additional Recommendations: Education on Hospice ? ?Prognosis:  ?Reevaluate in the morning for neck steps in transition of care.  Patient will likely need residential hospice for end-of-life care. ?  ?  ? ?Primary Diagnoses: ?Present on Admission: ? Septic shock (HCC) ? Multifocal pneumonia ? Lactic acidosis ? AKI (acute kidney injury) (HCC) ? Thrombocytopenia (HCC) ?  Acute respiratory failure with hypoxia (HCC) ? ? ?I have reviewed the medical record, interviewed the patient and family, and examined the patient. The following aspects are pertinent. ? ?Past Medical History:  ?Diagnosis Date  ? HTN (hypertension)   ? Hypothyroidism   ? ?Social History  ? ?Socioeconomic History  ? Marital status: Married  ?  Spouse name: Not on file  ? Number of children: Not on file  ? Years of education: Not on file  ? Highest education level: Not on file  ?Occupational History  ? Not on file  ?Tobacco Use  ? Smoking status: Never  ? Smokeless tobacco: Never  ?Substance and Sexual Activity  ? Alcohol use: Not Currently  ? Drug use: Never  ? Sexual activity: Not on file  ?Other Topics Concern  ? Not on file  ?Social History Narrative  ? Not on file  ? ?Social Determinants of Health  ? ?Financial Resource Strain: Not on file  ?Food Insecurity: Not on file  ?Transportation Needs: Not on file  ?Physical Activity: Not on file  ?Stress: Not on file  ?Social Connections: Not on file  ? ?No family history on file. ?Scheduled Meds: ? Chlorhexidine Gluconate Cloth  6 each Topical Daily  ? hydrocortisone sod succinate (SOLU-CORTEF) inj  100 mg Intravenous BID  ? ipratropium-albuterol  3 mL Nebulization Q6H  ? [START ON 05/05/2021] levothyroxine  75 mcg Intravenous Daily  ? pantoprazole (PROTONIX) IV  40 mg Intravenous Q24H  ? vitamin B-12  1,000 mcg Oral Daily  ? ?Continuous Infusions: ? sodium chloride Stopped (05/01/21 2355)  ? ampicillin-sulbactam (UNASYN) IV    ? ?PRN Meds:.acetaminophen, docusate sodium, ondansetron (ZOFRAN) IV, polyethylene glycol ?Medications Prior to Admission:  ?Prior to Admission medications   ?Medication Sig Start Date End Date Taking? Authorizing Provider  ?ALOE VESTA CLEAR ANTIFUNGAL 2 % OINT Apply 1 application. topically 3 (three) times daily as needed (nail fungus).   Yes [provider]  ?Amino Acids-Protein Hydrolys (PRO-STAT AWC) LIQD Take 30 mLs by mouth daily.    Yes [provider]  ?cefTRIAXone (ROCEPHIN) IVPB Inject 1 g into the vein daily. 04/30/21 05/06/21 Yes [provider]  ?divalproex (DEPAKOTE) 125 MG DR tablet Take 125 mg by mouth 2 (two) times daily. 03/18/20  Yes [provider]  ?donepezil (ARICEPT) 10 MG tablet Take 10 mg by mouth at bedtime. 03/18/20  Yes [provider]  ?fludrocortisone (FLORINEF) 0.1 MG tablet Take 1 tablet (0.1 mg total) by mouth daily. 04/01/20  Yes Marrion CoyZhang, Dekui, MD  ?guaifenesin (HUMIBID E) 400 MG TABS tablet Take 400 mg by mouth 3 (three) times daily. 04/29/21 05/06/21 Yes [provider]  ?ipratropium-albuterol (DUONEB) 0.5-2.5 (3) MG/3ML SOLN Take 3 mLs by nebulization every 6 (six) hours. 04/30/21 05/10/21 Yes [provider]  ?levothyroxine (SYNTHROID) 112 MCG tablet Take 112 mcg by mouth daily. 03/10/20  Yes  [provider]  ?lisinopril (ZESTRIL) 20 MG tablet Take 20 mg by mouth daily. Hold for pulse less that 60 and notify provider  ?Hold for SBP 100 or less and notify provider 02/29/20  Yes [provider]  ?Skin Protectants, Misc. (EUCERIN) cream Apply 1 application. topically daily.   Yes [provider]  ?Sodium Hypochlorite 0.5 % SOLN Apply 1 application. topically 2 (two) times daily.   Yes [provider]  ?vitamin B-12 (CYANOCOBALAMIN) 1000 MCG tablet Take 1 tablet (1,000 mcg total) by mouth daily. ?Patient taking differently: Take 500 mcg by mouth daily. 04/01/20  Yes Marrion Coy, MD  ?Wound Dressings (MEDIHONEY WOUND/BURN DRESSING) PSTE Apply 1 application. topically daily. Small amount, Topical, Skin tear left ankle. Clean, apply Medhoney, calcium alginate, cover with dry dressing q day 02/17/21  Yes [provider]  ? ?No Known Allergies ?Review of Systems  ?Unable to perform ROS: Acuity of condition  ? ?Physical Exam ?Constitutional:   ?   Appearance: He is cachectic. He is ill-appearing.  ?   Interventions: Nasal cannula in place.   ?HENT:  ?   Head:  ?   Comments: Audible throat secretions, clears somewhat with cough ?Cardiovascular:  ?   Rate and Rhythm: Normal rate.  ?Pulmonary:  ?   Effort: Tachypnea present.  ?Skin: ?   General:

## 2021-05-04 NOTE — Progress Notes (Addendum)
Bottom denture found on covers. Placed in denture cup and labeled. ?

## 2021-05-04 NOTE — Progress Notes (Signed)
Manufacturing engineer [facility]) Hospital Liaison Note ? ?Received request from PMT Provider/Mary L., NP for family interest in [facility]. . Transitions of Care Manager  Jeanna aware. Visited patient at bedside and spoke with stepson/[phone] to confirm interest and explain services. ? ?Approval for Hospice Home is determined by [facility] MD. Once [facility] MD has determined Hospice Home eligibility, [place] will update hospital staff and family. Eligibly is eligible.  ? ?Unfortunately, Hospice Home is not able to offer a room today. Family and [facility] Manager aware hospital liaison will follow up tomorrow or sooner if a room becomes available.  ? ?Please do not hesitate to call with any hospice related questions.  ?  ?Thank you for the opportunity to participate in this patient's care. ? ?Daphene Calamity, MSW ?[place]  ?[phone] ? ?

## 2021-05-04 NOTE — TOC Progression Note (Signed)
Transition of Care (TOC) - Progression Note  ? ? ?Patient Details  ?Name: Jeffrey Nielsen ?MRN: [id number] ?Date of Birth: [birth year] ? ?Transition of Care (TOC) CM/SW Contact  ?Shelbie Hutching, RN ?Phone Number: ?05/04/2021, 2:02 PM ? ?Clinical Narrative:    ?Patient is a LTC resident at [facility].  Palliative met with patient's son at the bedside.  Son has decided on comfort care.  Referral for hospice home given to [facility] with [facility] by Palliative NP.   ? ? ?Expected Discharge Plan: [place] ?Barriers to Discharge: Continued Medical Work up ? ?Expected Discharge Plan and Services ?Expected Discharge Plan: [place] ?  ?Discharge Planning Services: CM Consult ?Post Acute Care Choice: Hospice ?Living arrangements for the past 2 months: [place] ?                ?DME Arranged: N/A ?DME Agency: NA ?  ?  ?  ?HH Arranged: NA ?[place] Agency: NA ?  ?  ?  ? ? ?Social Determinants of Health (SDOH) Interventions ?  ? ?Readmission Risk Interventions ?   ? View : No data to display.  ?  ?  ?  ? ? ?

## 2021-05-05 DIAGNOSIS — Z515 Encounter for palliative care: Secondary | ICD-10-CM

## 2021-05-05 DIAGNOSIS — R627 Adult failure to thrive: Secondary | ICD-10-CM

## 2021-05-05 DIAGNOSIS — R52 Pain, unspecified: Secondary | ICD-10-CM

## 2021-05-05 LAB — ADAMTS13 ACTIVITY: Adamts 13 Activity: 28.7 % — CL (ref >66.8)

## 2021-05-05 LAB — ADAMTS13 ANTIBODY: ADAMTS13 Antibody: 4 Units/mL (ref <12)

## 2021-05-05 LAB — CULTURE, RESPIRATORY W GRAM STAIN

## 2021-05-05 MED ORDER — DIPHENHYDRAMINE HCL 50 MG/ML IJ SOLN
25.0000 mg | Freq: Four times a day (QID) | INTRAMUSCULAR | Status: DC | PRN
  Administered 2021-05-05: 25 mg via INTRAVENOUS
  Filled 2021-05-05: qty 1

## 2021-05-05 MED ORDER — LORAZEPAM 2 MG/ML IJ SOLN: 1.0000 mg | INTRAMUSCULAR | 0 refills | Status: AC | PRN

## 2021-05-05 MED ORDER — MORPHINE SULFATE (PF) 2 MG/ML IV SOLN: 2.0000 mg | INTRAVENOUS | 0 refills | Status: AC | PRN

## 2021-05-05 NOTE — Progress Notes (Signed)
Patient ID: CHIDIEBERE ROA, male   DOB: [birth year], 88 y.o.   MRN: [id number] ? ? ? ?Progress Note from the Palliative Medicine Team at [facility] ? ? ?Patient Name: Jeffrey Nielsen        ?Date: 05/05/2021 ?DOB: Aug 10, 1926  Age: 88 y.o. MRN#: [id number] ?Attending Physician: Wyvonnia Dusky, MD ?Primary Care Physician: Alvester Morin, MD ?Admit Date: 05/01/2021 ? ? ?Medical records reviewed  ? ?88 y.o. male  admitted from [facility] on 05/01/2021 with  confirmed pneumonia dx via CXR a few days ago.  Rocephin x2 doses initiated at facility, however symptoms worsened with O2 sats in the low 80's on RA prompting ER visit.  ?  ?Patient with continued physical, functional and cognitive decline over the past many months.  He has been a resident of a skilled nursing facility since 2014.  He has significant severe atherosclerotic changes in both lower extremities associated with ulceration and tissue loss of the right foot.  Per vascular surgery right lower extremity with limb threatening ischemia ?  ?Yesterday decision was made by family to shift to intensive comfort care, focusing on comfort and dignity at end-of-life. ? ?This NP visited patient at the bedside as a follow up for palliative medicine needs and emotional support. ?Patient appears comfortable, he is in no acute distress.  Patient has required 2 doses of IV morphine this morning. ? ?I reached out and spoke to patient's stepson/main decision maker/[name] by telephone to update him on [name]'s current medical situation. ? ?Education offered on the natural trajectory and expectations at end-of-life. ? ?Plan is still to transition to residential hospice when bed becomes available. ? ?Questions and concerns addressed.   PMT will continue to support holistically. ? ? ?Wadie Lessen NP  ?Palliative Medicine Team Team Phone # [phone] ?Pager [phone] ?  ?

## 2021-05-05 NOTE — Discharge Summary (Signed)
?  ?Physician Discharge Summary  ?NICOLI NARDOZZI HYW:[id number] DOB: [birth year] DOA: 05/01/2021 ? ?PCP: Alvester Morin, MD ? ?Admit date: 05/01/2021 ?Discharge date: 05/05/2021 ? ?Admitted From: home ?Disposition:  hospice facility  ? ?Recommendations for Outpatient Follow-up:  ?Follow up with hospice provider ASAP  ? ?Home Health: no  ?Equipment/Devices: ? ?Discharge Condition: hospice  ?CODE STATUS: DNR ?Diet recommendation: as tolerated  ? ?Brief/Interim Summary: ?HPI was taken from NP Meda Coffee: ?This is a 88 yo male who presented to [facility] ER via EMS from [facility] with confirmed pneumonia dx via CXR a few days ago.  Rocephin x2 doses initiated at facility, however symptoms worsened with O2 sats in the low 80's on RA prompting ER visit.  HPI obtained from chart pt unable to participate due to severe respiratory distress along with acute encephalopathy. ?  ?ED Course ?Upon arrival to the ER pt significantly hypothermic temp 89.3 degrees F rectally, bradycardic hr 49-50's, and hypotensive map 54-60's.  Pt also hypoxic requiring 10L/min via NRB.  COVID-19/Influenza A&B by PCR negative, however CXR revealed widespread pneumonia along with possible pulmonary edema.  Lab results revealed CO2 19, BUN 89, creatinine 2.84, alk phos 236, albumin 2.1, lactic acid 4.4, hgb 8.6, platelets 17, and PT 19.9/INR 1.7.  UA with small leukocytes/rare bacteria/>50 rbc/>50 wbc.  Sepsis protocol initiated pt received 2L LR bolus, cefepime, and vancomycin.  Due to continued hypotension levophed gtt initiated.  PCCM team contacted for ICU admission.  ? ?As per Dr. Lanney Gins: ?04/7: Pt admitted to ICU with acute kidney injury, severe sepsis, and acute hypoxic respiratory failure secondary to widespread pneumonia requiring levophed gtt  ?4/8: remains on peripheral pressors, body temperature normalized on Bair hugger, on HHFNC ?4/9: pressor requirement down; unable to pass NGT after multiple attempts ? ?As per Dr. Jimmye Norman 05/05/21:  Pt was already made comfort care only on 05/04/21. Continue w/ comfort care orders only. Pt was d/c to hospice facility.  ? ?Discharge Diagnoses:  ?Principal Problem: ?  Septic shock ([place]) ?Active Problems: ?  AKI (acute kidney injury) ([place]) ?  Multifocal pneumonia ?  Lactic acidosis ?  Thrombocytopenia ([place]) ?  Acute respiratory failure with hypoxia ([place]) ? ?Failure to thrive: secondary to all below. Continue w/ comfort care only  ? ?Acute hypoxic respiratory failure: secondary to multifocal pneumonia. Comfort care only  ? ?Septic shock: previously required pressors. Continue w/ comfort care  ? ?AKI: secondary to ATN in setting sepsis. Comfort care  ? ?Metabolic acidosis:  likely secondary AKI. Comfort care only  ? ?Acute on chronic thrombocytopenia: in setting of sepsis. No more labs as pt is comfort care only  ?  ?Atherosclerosis of bilateral lower extremities: with right heel ulceration. Comfort care  ?  ?Acute toxic encephalopathy: continue on comfort care  ?  ?Hypothyroidism: continue on comfort care ? ? ?Discharge Instructions ? ?Discharge Instructions   ? ? Diet general   Complete by: As directed ?  ? Discharge instructions   Complete by: As directed ?  ? F/u w/ hospice provider as soon as possible  ? Increase activity slowly   Complete by: As directed ?  ? No wound care   Complete by: As directed ?  ? ?  ? ?Allergies as of 05/05/2021   ?No Known Allergies ?  ? ?  ?Medication List  ?  ? ?STOP taking these medications   ? ?Aloe [place] Clear Antifungal 2 % Oint ?Generic drug: Miconazole Nitrate ?  ?cefTRIAXone  IVPB ?Commonly known as:  ROCEPHIN ?  ?divalproex 125 MG DR tablet ?Commonly known as: DEPAKOTE ?  ?donepezil 10 MG tablet ?Commonly known as: ARICEPT ?  ?eucerin cream ?  ?fludrocortisone 0.1 MG tablet ?Commonly known as: FLORINEF ?  ?guaifenesin 400 MG Tabs tablet ?Commonly known as: HUMIBID E ?  ?ipratropium-albuterol 0.5-2.5 (3) MG/3ML Soln ?Commonly known as: DUONEB ?  ?leptospermum manuka honey Pste  paste ?  ?levothyroxine 112 MCG tablet ?Commonly known as: SYNTHROID ?  ?lisinopril 20 MG tablet ?Commonly known as: ZESTRIL ?  ?Pro-Stat AWC Liqd ?  ?Sodium Hypochlorite 0.5 % Soln ?  ?vitamin B-12 1000 MCG tablet ?Commonly known as: CYANOCOBALAMIN ?  ? ?  ? ?TAKE these medications   ? ?LORazepam 2 MG/ML injection ?Commonly known as: ATIVAN ?Inject 0.5 mLs (1 mg total) into the vein every 4 (four) hours as needed for anxiety. ?  ?morphine (PF) 2 MG/ML injection ?Inject 1 mL (2 mg total) into the vein every hour as needed (dyspnea). ?  ? ?  ? ? ?No Known Allergies ? ?Consultations: ?ICU  ?Hospice  ? ? ?Procedures/Studies: ?DG Abd 1 View ? ?Result Date: 05/03/2021 ?CLINICAL DATA:  Nasogastric tube placement. Nasogastric tube recently removed. EXAM: ABDOMEN - 1 VIEW COMPARISON:  Plain films of the abdomen dated 05/03/2021 and 05/02/2021. FINDINGS: No enteric tube seen. RIGHT lower quadrant and pelvis is not imaged on this exam. Visualized bowel gas pattern is nonobstructive. No evidence of free intraperitoneal air. Probable LEFT pleural effusion. IMPRESSION: 1. No enteric tube seen. 2. Visualized bowel gas pattern is nonobstructive. 3. Probable LEFT pleural effusion. Electronically Signed   By: Franki Cabot M.D.   On: 05/03/2021 09:01  ? ?DG Chest Port 1 View ? ?Result Date: 05/01/2021 ?CLINICAL DATA:  Sepsis.  Pneumonia. EXAM: PORTABLE CHEST 1 VIEW COMPARISON:  03/29/2020 FINDINGS: Chronic cardiomegaly. Aortic atherosclerosis. Widespread bilateral pulmonary opacities consistent with bronchopneumonia. Most dense consolidation is in the right upper lobe. There could also be coexistence pulmonary edema. Small amount of pleural fluid possibly present on the left. IMPRESSION: Widespread pneumonia, most dense consolidation in the right upper lobe. Coexistent pulmonary edema not excluded. Probable small left effusion. Electronically Signed   By: Nelson Chimes M.D.   On: 05/01/2021 15:48  ? ?DG Abd Portable 1V ? ?Result Date:  05/03/2021 ?CLINICAL DATA:  Evaluate ileus. EXAM: PORTABLE ABDOMEN - 1 VIEW COMPARISON:  Portable abdomen film yesterday at 12:09 p.m. FINDINGS: 6:05 a.m., 05/03/2021. There is mild dilatation of left lower quadrant loops up to 3.1 cm. The ascending and transverse colon are aerated but nondilated. No other dilated bowel is seen. There is no supine evidence of free air. Visceral shadows are stable. There are phleboliths. The bladder is catheterized. There is dextroscoliosis and degenerative change of the spine. If there is an enteric tube in place, it is not seen. The domes of the diaphragm and lung bases are not included in the exam. IMPRESSION: Few mildly dilated right lower abdominal small bowel segments. No other dilated bowel is seen. Electronically Signed   By: Telford Nab M.D.   On: 05/03/2021 06:54  ? ?DG Abd Portable 1V ? ?Result Date: 05/02/2021 ?CLINICAL DATA:  Dobbhoff placement. EXAM: PORTABLE ABDOMEN - 1 VIEW COMPARISON:  None. FINDINGS: No enteric tube is seen. Dilated gas-filled small bowel loops noted within the RIGHT upper quadrant. Probable LEFT pleural effusion. Airspace opacities within the RIGHT lung are incompletely imaged. IMPRESSION: 1. No enteric tube is seen within the lower mediastinum or upper abdomen. 2. Dilated gas-filled  small bowel loops within the RIGHT upper quadrant, suggesting partial small bowel obstruction versus ileus. 3. Probable LEFT pleural effusion. Electronically Signed   By: Franki Cabot M.D.   On: 05/02/2021 12:47  ? ?VAS [place] ABI WITH/WO TBI ? ?Result Date: 04/06/2021 ? LOWER EXTREMITY DOPPLER STUDY Patient Name:  SHADEE MONTOYA  Date of Exam:   04/06/2021 Medical Rec #: [id number]     Accession #:    [id number] Date of Birth: 10/31/1926     Patient Gender: M Patient Age:   84 years Exam Location:  [facility] Procedure:      VAS [place] ABI WITH/WO TBI Referring Phys: --------------------------------------------------------------------------------  Other Factors: Wound  on right heel x 3 months.  Performing Technologist: Concha Norway RVT  Examination Guidelines: A complete evaluation includes at minimum, Doppler waveform signals and systolic blood pressure reading at th

## 2021-05-05 NOTE — Progress Notes (Addendum)
1540 Report given to [facility] per facility request. Plan transfer of patient after 1900 today. 1630 Patient scratching self vigorously until his skin and mouth  bleed. Provider contacted. Prn Benadryl given per orders.1800 Quiet day. Morphine prn given x 4 for yelling in pain. Patient not as calm in the evening hours. Transfer to [facility] sometime after 1900. ?

## 2021-05-05 NOTE — Progress Notes (Signed)
Patient escorted to hospice house via EMS. Patient is currently resting in bed. No visitors are at the bedside. Dentures and orange t-shirt were sent with patient. ?

## 2021-05-05 NOTE — TOC Transition Note (Signed)
Transition of Care (TOC) - CM/SW Discharge Note ? ? ?Patient Details  ?Name: Jeffrey Nielsen ?MRN: [id number] ?Date of Birth: 1926/07/24 ? ?Transition of Care (TOC) CM/SW Contact:  ?Shelbie Hutching, RN ?Phone Number: ?05/05/2021, 2:35 PM ? ? ?Clinical Narrative:    ?Patient has a bed over at the Hospice facility, [facility] in [place].  Once [place] with [facility] has contacted family and set up a time for consents to be signed, then RN can call report and EMS transport can be arranged.  ? ? ?Final next level of care: [place] ?Barriers to Discharge: Barriers Resolved ? ? ?Patient Goals and CMS Choice ?Patient states their goals for this hospitalization and ongoing recovery are:: Family agrees to comfort and hospice home ?CMS Medicare.gov Compare Post Acute Care list provided to:: Patient Represenative (must comment) ?Choice offered to / list presented to : Adult Children ? ?Discharge Placement ?  ?           ?Patient chooses bed at: Other - please specify in the comment section below: [facility]) ?Patient to be transferred to facility by: [facility] ?Name of family member notified: Barnabas Lister- ?Patient and family notified of of transfer: 05/05/21 ? ?Discharge Plan and Services ?  ?Discharge Planning Services: CM Consult ?Post Acute Care Choice: Hospice          ?DME Arranged: N/A ?DME Agency: NA ?  ?  ?  ?HH Arranged: NA ?[place] Agency: NA ?  ?  ?  ? ?Social Determinants of Health (SDOH) Interventions ?  ? ? ?Readmission Risk Interventions ?   ? View : No data to display.  ?  ?  ?  ? ? ? ? ? ?

## 2021-05-05 NOTE — Progress Notes (Addendum)
[facility] IC09 Civil engineer, contracting [facility]) Hospital Liaison Note ?  ?Unfortunately, Hospice Home is not able to offer a room today. [facility] Manager aware hospital liaison will follow up tomorrow or sooner if a room becomes available.  ?  ?Please do not hesitate to call with any hospice related questions.  ?  ?Thank you for the opportunity to participate in this patient's care. ? ?Addendum: ?  ?[facility] staff is contacting family for Consent forms to be completed, plan is for forms to be complete by 5p. ?  ?EMS notified of patient D/C and transport arranged by MSW for 7p per Hospice Home staff request. Attending Physician/Dr. Georgeann Oppenheim & TOC also notified of transport arrangement.  ?  ?Please send signed DNR form with patient and RN call report to [phone].  ?  ?Odette Fraction, MSW ?[facility] Hospital Liaison ?[phone] ?  ?

## 2021-05-06 LAB — CULTURE, BLOOD (ROUTINE X 2): Culture: NO GROWTH

## 2021-05-08 LAB — CULTURE, BLOOD (ROUTINE X 2)
Culture: NO GROWTH
Culture: NO GROWTH

## 2021-05-25 DEATH — deceased

## 2022-12-18 IMAGING — DX DG CHEST 1V PORT
1 series · 1 of 1 positions shown · non-contrast
Comparison: Chest x-ray 01/28/2013

CLINICAL DATA: Weakness.

EXAM:
PORTABLE CHEST 1 VIEW

[chest ap]
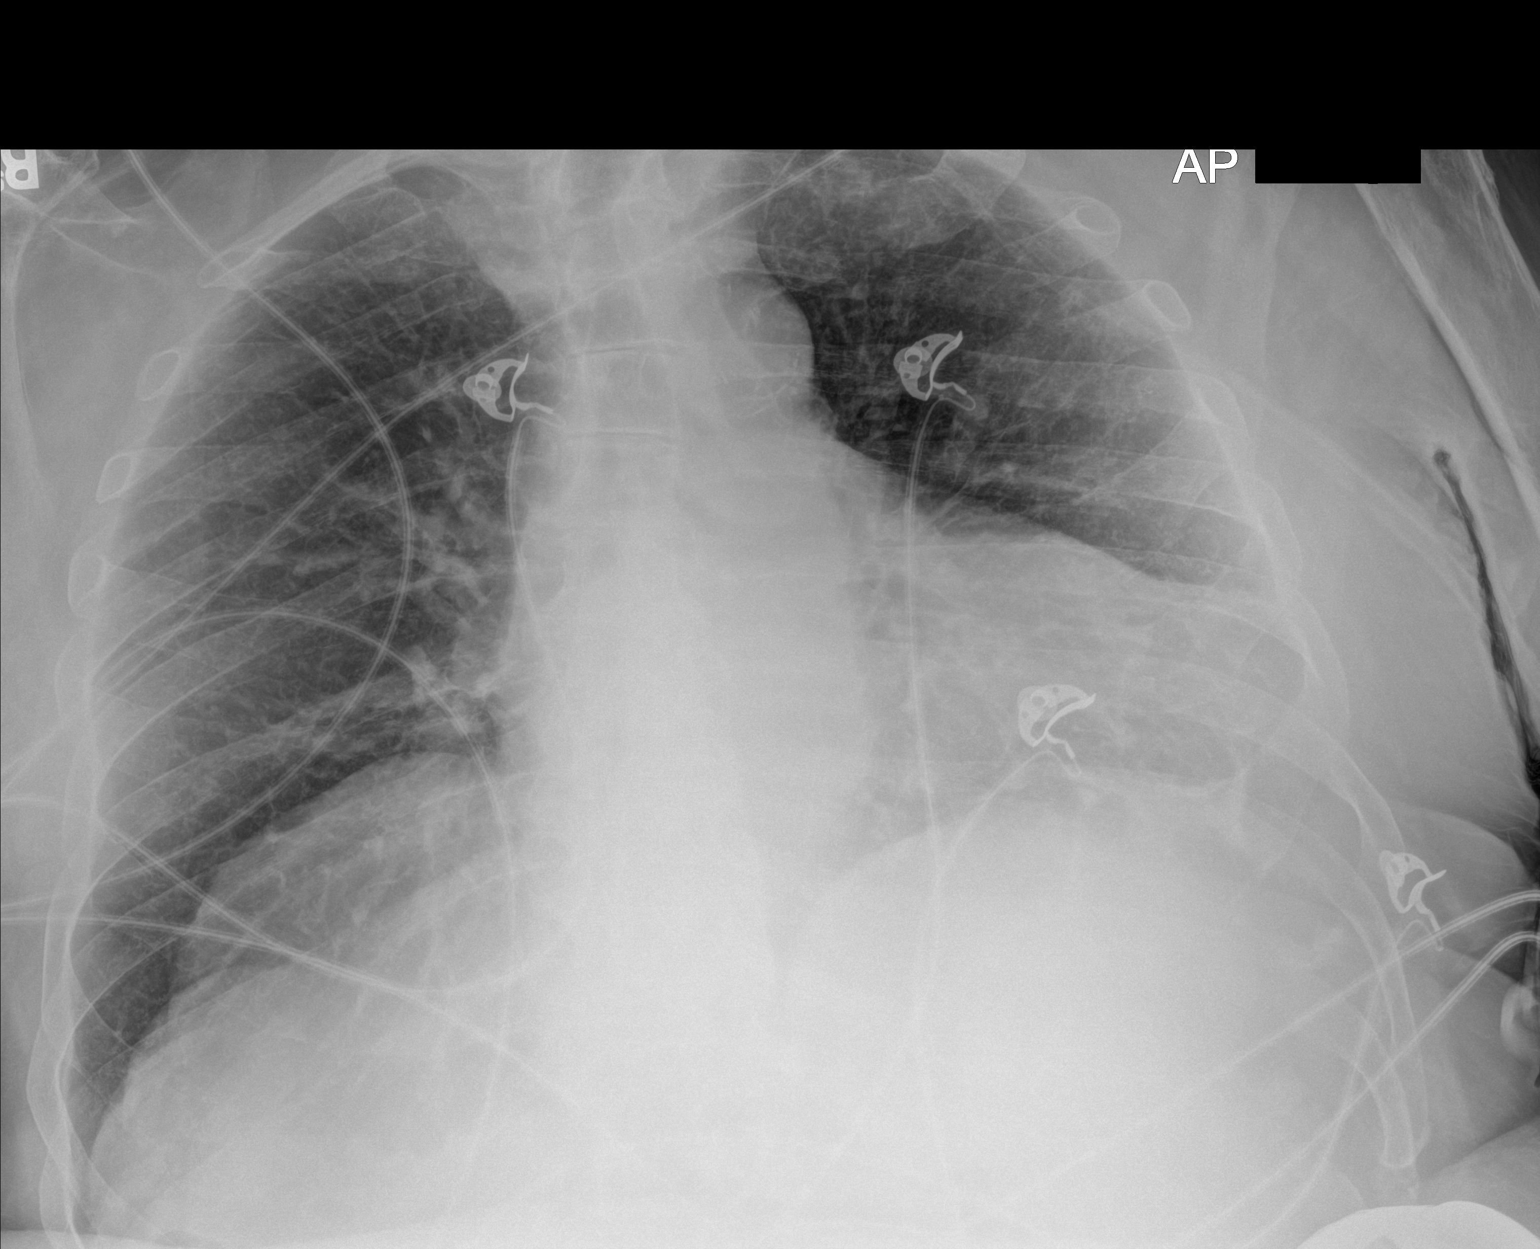

[1 of 1 positions shown; findings below may reference images not displayed]

FINDINGS: The heart size and mediastinal contours are unchanged. Aortic arch
calcifications.

No focal consolidation. No pulmonary edema. No pleural effusion. No
pneumothorax.

No acute osseous abnormality.
IMPRESSION: No active disease.

## 2024-01-22 IMAGING — DX DG ABD PORTABLE 1V
2 series · 2 of 2 positions shown · non-contrast
Comparison: Portable abdomen film yesterday at [DATE] p.m.

CLINICAL DATA: Evaluate ileus.

EXAM:
PORTABLE ABDOMEN - 1 VIEW

[abdomen supine (1 of 2)]
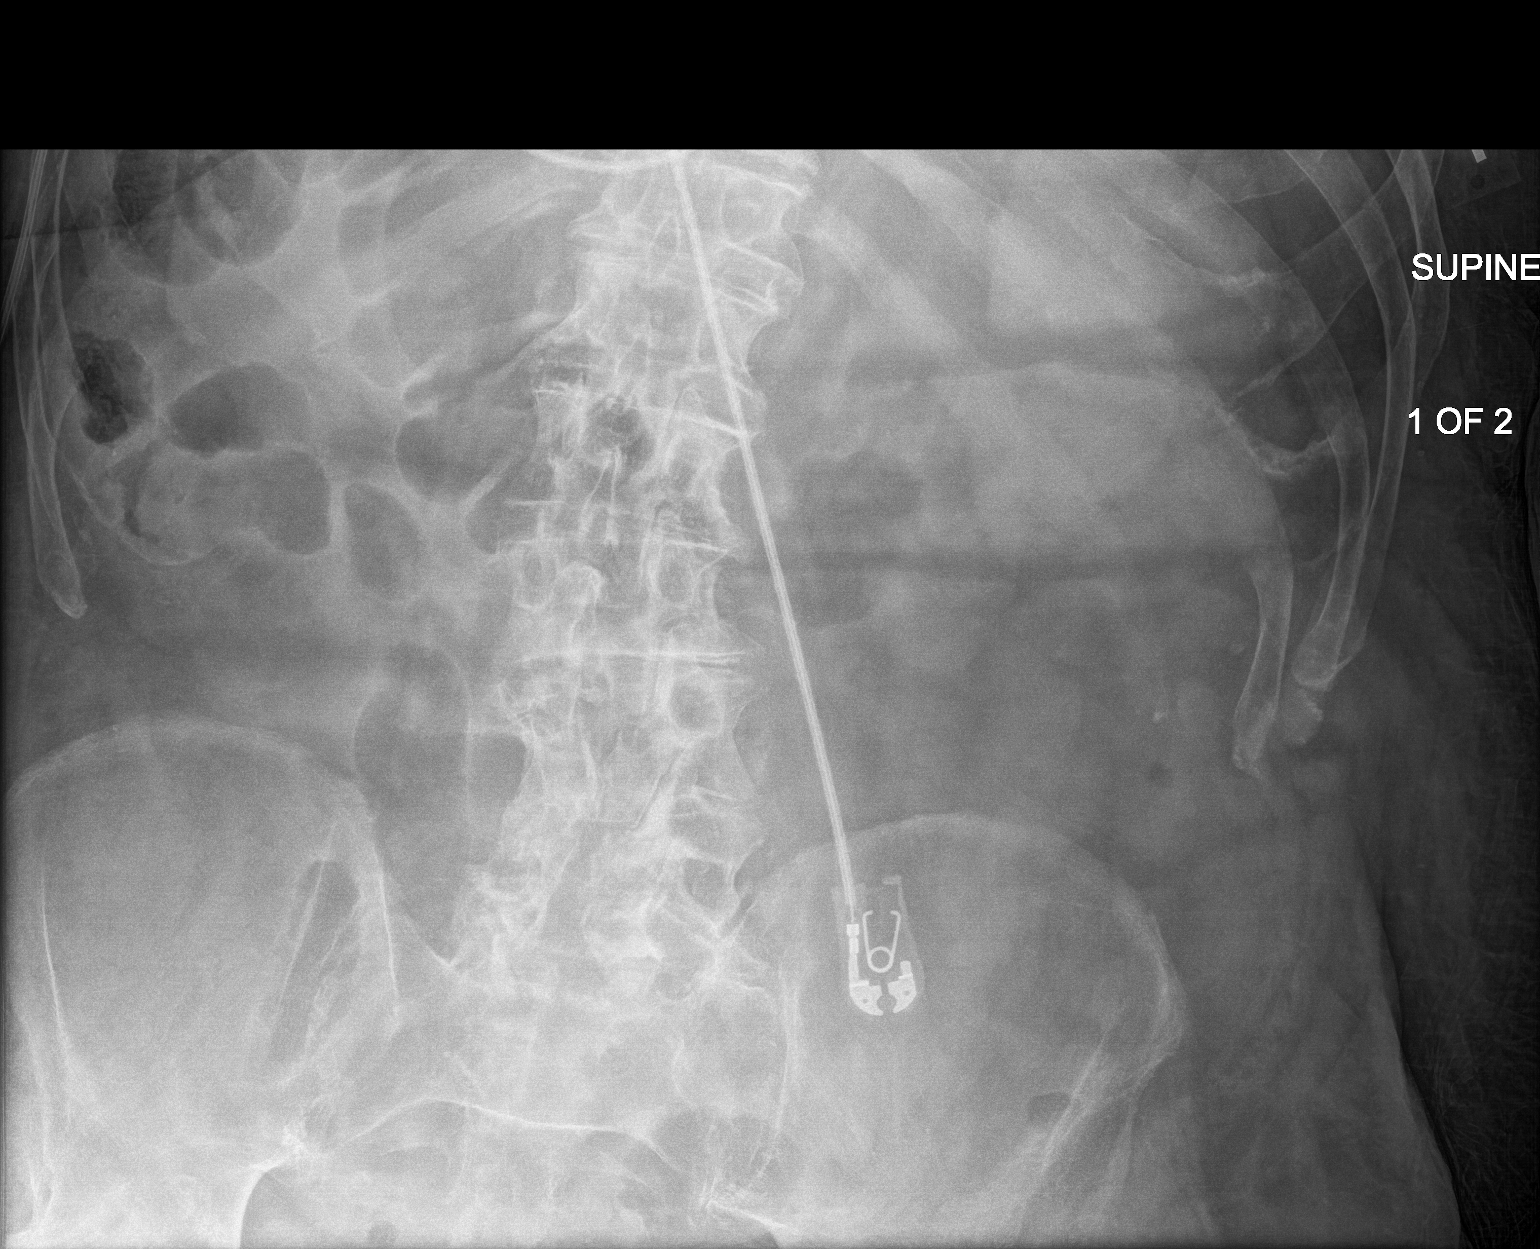

[abdomen supine (2 of 2)]
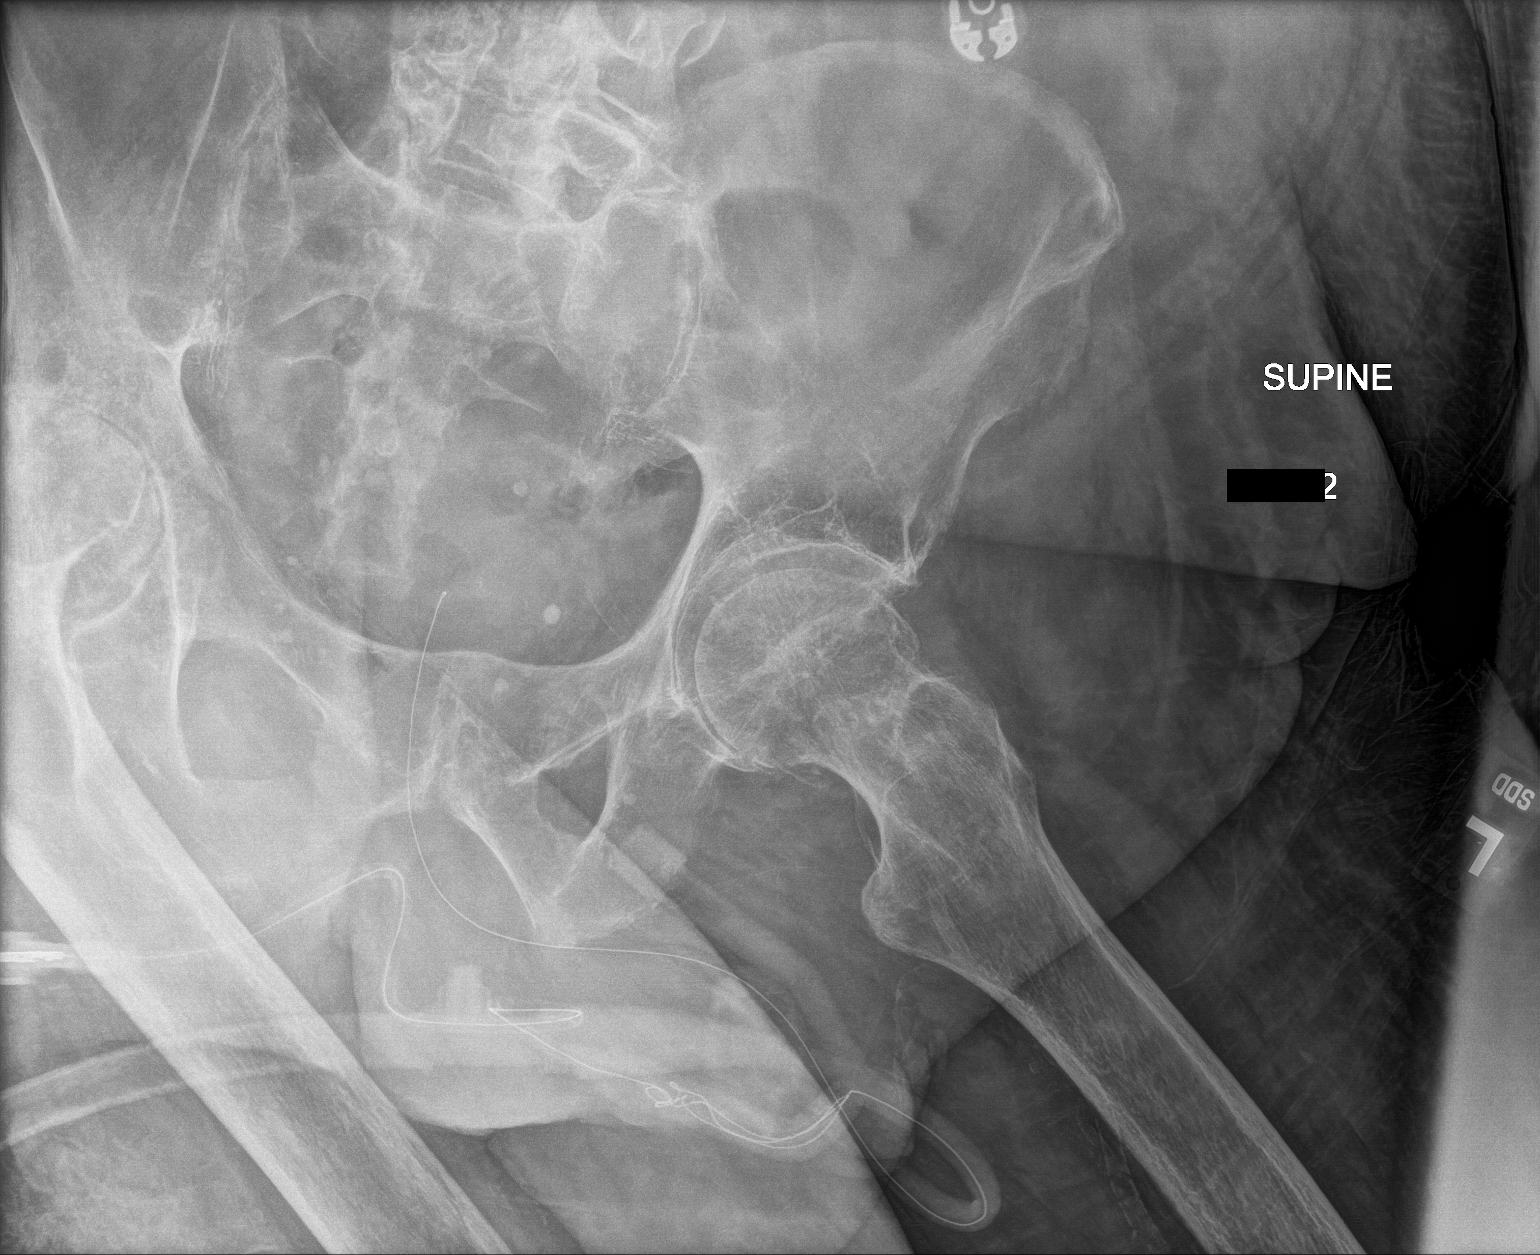

[2 of 2 positions shown; findings below may reference images not displayed]

FINDINGS: [DATE] a.m., 05/03/2021. There is mild dilatation of left lower
quadrant loops up to 3.1 cm.

The ascending and transverse colon are aerated but nondilated.

No other dilated bowel is seen. There is no supine evidence of free
air.

Visceral shadows are stable. There are phleboliths. The bladder is
catheterized. There is dextroscoliosis and degenerative change of
the spine.

If there is an enteric tube in place, it is not seen. The domes of
the diaphragm and lung bases are not included in the exam.
IMPRESSION: Few mildly dilated right lower abdominal small bowel segments. No
other dilated bowel is seen.

## 2024-01-22 IMAGING — DX DG ABDOMEN 1V
1 series · 1 of 1 positions shown · non-contrast
Comparison: Plain films of the abdomen dated 05/03/2021 and
05/02/2021.

CLINICAL DATA: Nasogastric tube placement. Nasogastric tube
recently removed.

EXAM:
ABDOMEN - 1 VIEW

[abdomen supine]
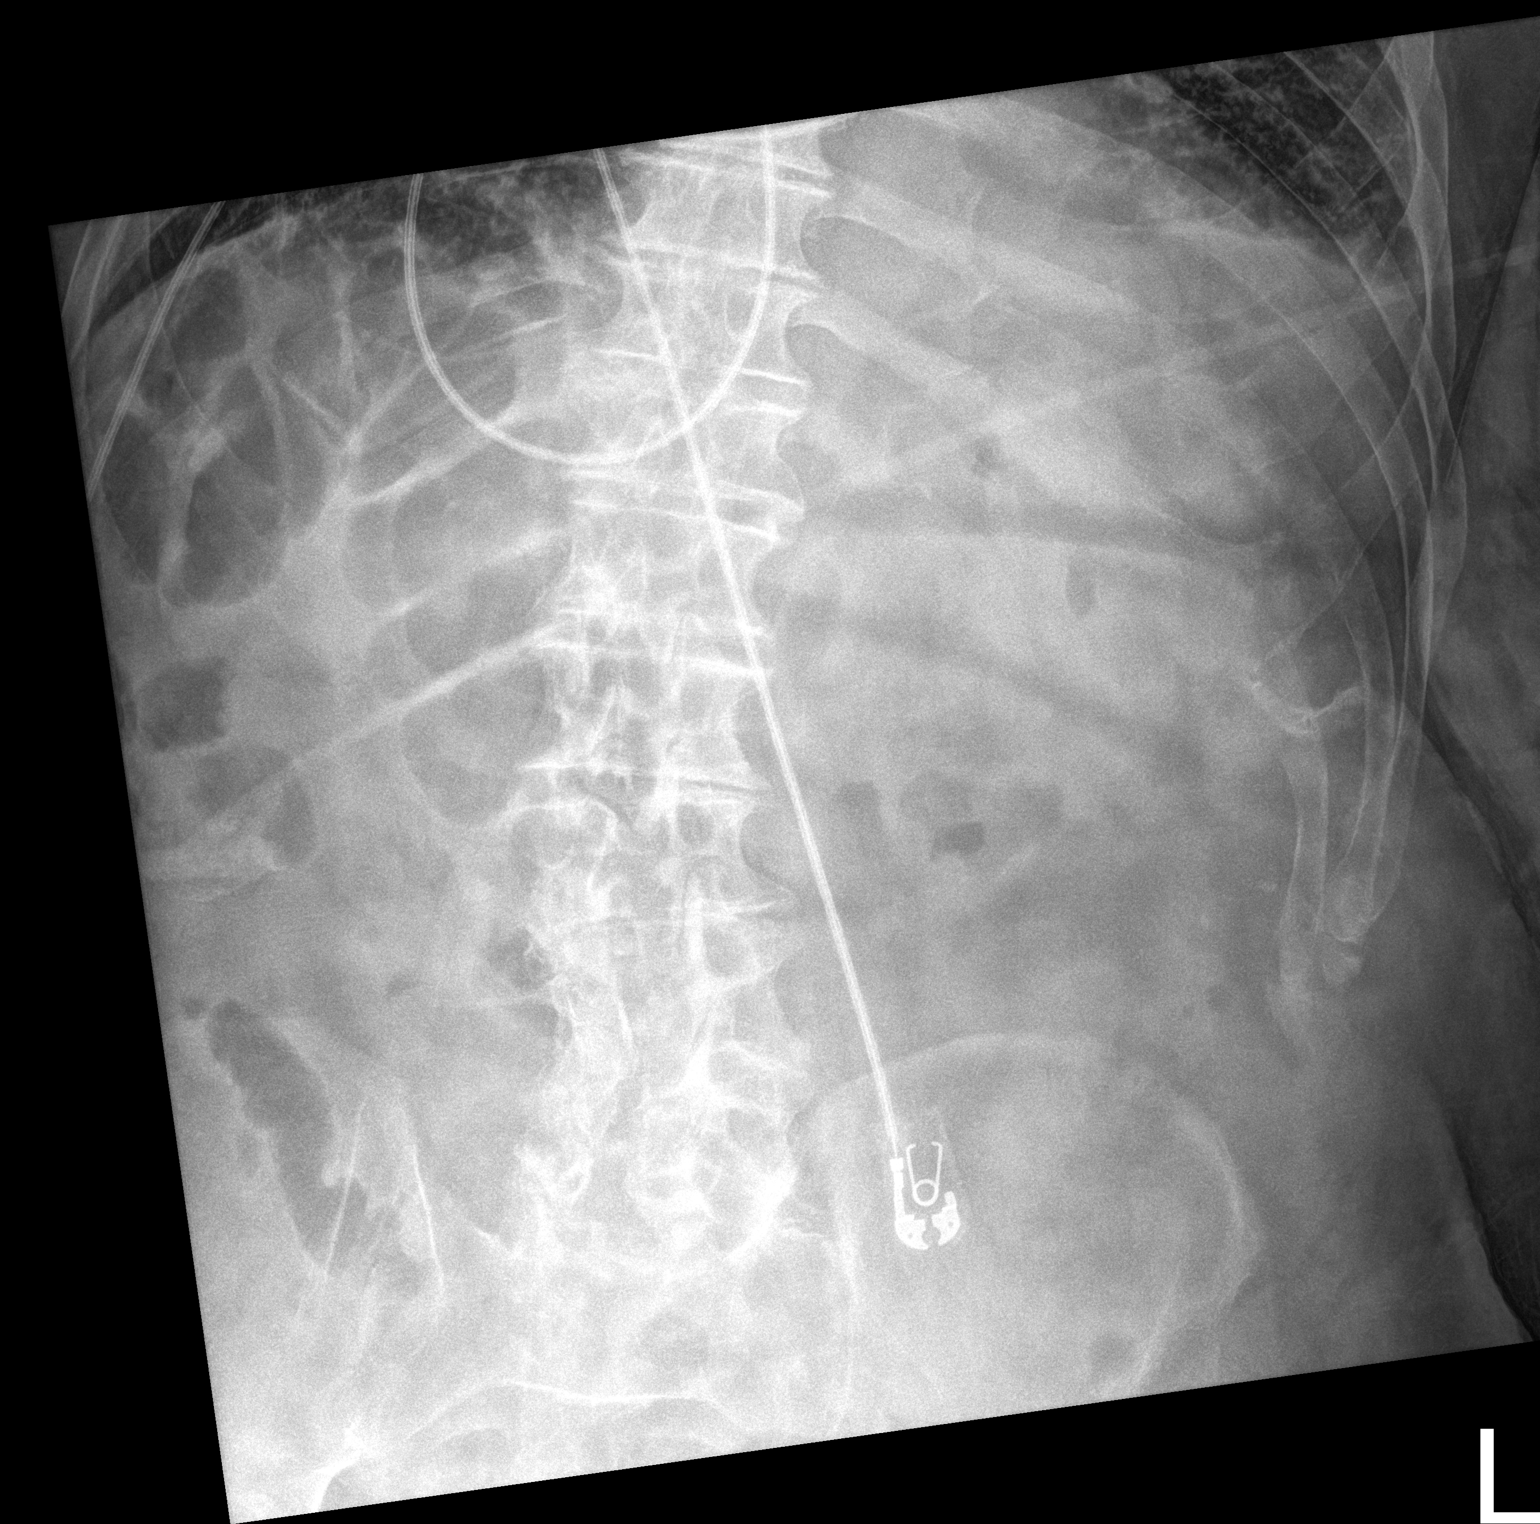

[1 of 1 positions shown; findings below may reference images not displayed]

FINDINGS: No enteric tube seen. RIGHT lower quadrant and pelvis is not imaged
on this exam. Visualized bowel gas pattern is nonobstructive. No
evidence of free intraperitoneal air. Probable LEFT pleural
effusion.
IMPRESSION: 1. No enteric tube seen.
2. Visualized bowel gas pattern is nonobstructive.
3. Probable LEFT pleural effusion.
